# Patient Record
Sex: Male | Born: 1945 | Race: White | Hispanic: No
Health system: Southern US, Community
[De-identification: ages and names within clinical notes are randomized; demographics above are authoritative.]

## PROBLEM LIST (undated history)

## (undated) DIAGNOSIS — E785 Hyperlipidemia, unspecified: Secondary | ICD-10-CM

## (undated) DIAGNOSIS — I10 Essential (primary) hypertension: Secondary | ICD-10-CM

## (undated) DIAGNOSIS — E119 Type 2 diabetes mellitus without complications: Secondary | ICD-10-CM

## (undated) HISTORY — DX: Essential (primary) hypertension: I10

## (undated) HISTORY — DX: Hyperlipidemia, unspecified: E78.5

## (undated) HISTORY — DX: Type 2 diabetes mellitus without complications: E11.9

## (undated) HISTORY — PX: KNEE SURGERY: SHX244

## (undated) HISTORY — PX: BLADDER SURGERY: SHX569

---

## 2004-04-21 ENCOUNTER — Ambulatory Visit (HOSPITAL_COMMUNITY): Admission: RE | Admit: 2004-04-21 | Discharge: 2004-04-21 | Payer: Self-pay | Admitting: Urology

## 2004-04-21 ENCOUNTER — Ambulatory Visit (HOSPITAL_BASED_OUTPATIENT_CLINIC_OR_DEPARTMENT_OTHER): Admission: RE | Admit: 2004-04-21 | Discharge: 2004-04-21 | Payer: Self-pay | Admitting: Urology

## 2005-04-14 ENCOUNTER — Encounter: Admission: RE | Admit: 2005-04-14 | Discharge: 2005-04-14 | Payer: Self-pay | Admitting: Occupational Medicine

## 2006-05-09 ENCOUNTER — Encounter: Admission: RE | Admit: 2006-05-09 | Discharge: 2006-05-09 | Payer: Self-pay | Admitting: Occupational Medicine

## 2007-12-13 IMAGING — CR DG CHEST 1V
1 series · 1 of 1 positions shown · non-contrast
Comparison: 04/14/05.

CLINICAL DATA: Biannual physical exam.
 CHEST ? 1 VIEW:

[view not recorded]
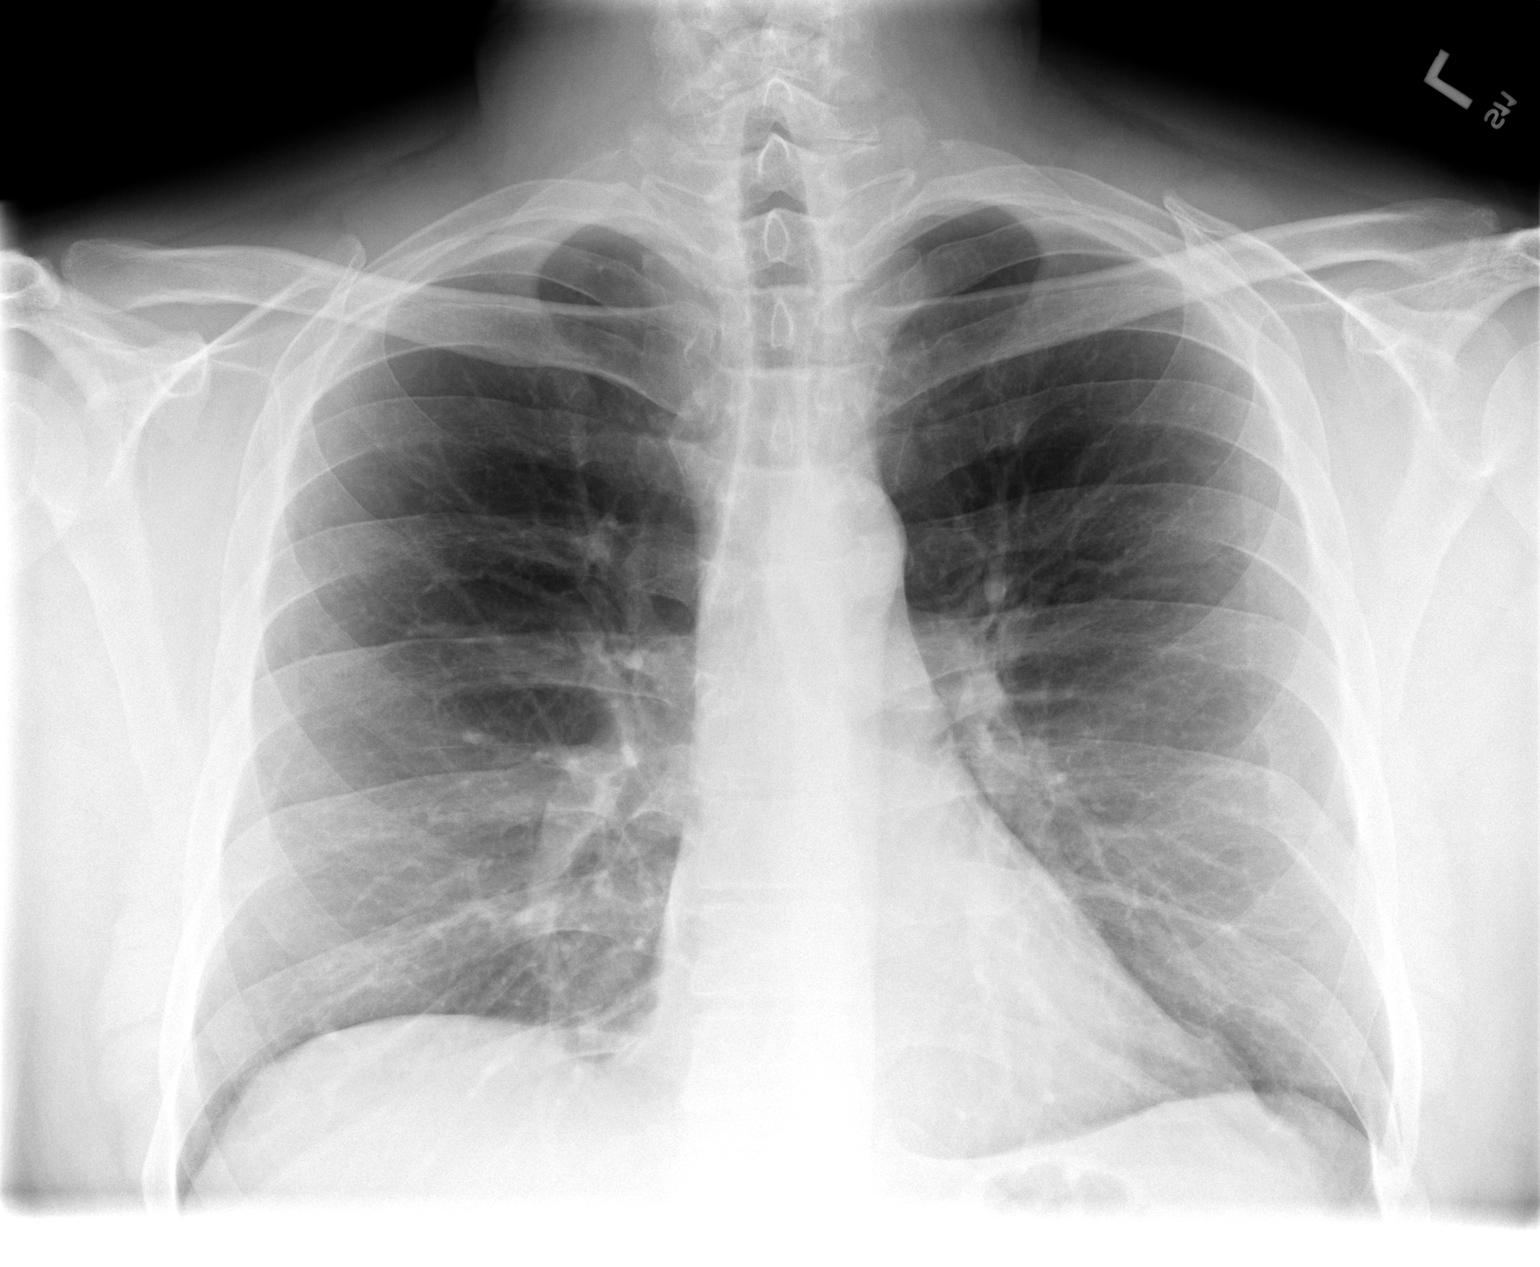

[1 of 1 positions shown; findings below may reference images not displayed]

FINDINGS: A single view of the chest shows the lungs to be clear.  Nipple shadows are noted at the lung bases.  The heart is within normal limits in size.   There are three vague nodular opacities at the left lung base.  I believe one of these represents nipple shadow but the other two nodules are less distinct.  Repeat deeper inspiration chest x-ray with nipple markers is recommended.
IMPRESSION: Probable nipple shadow left lung base although there are two vague nodular opacities adjacent.  Recommend repeat deeper inspiration PA chest with nipple markers.

## 2022-02-21 ENCOUNTER — Observation Stay (HOSPITAL_COMMUNITY)
Admission: EM | Admit: 2022-02-21 | Discharge: 2022-02-22 | Disposition: A | Discharge status: Home / Self Care | Payer: Medicare Other | Attending: Internal Medicine | Admitting: Internal Medicine

## 2022-02-21 ENCOUNTER — Encounter (HOSPITAL_COMMUNITY): Payer: Self-pay | Admitting: Emergency Medicine

## 2022-02-21 ENCOUNTER — Emergency Department (HOSPITAL_COMMUNITY): Payer: Medicare Other

## 2022-02-21 ENCOUNTER — Other Ambulatory Visit: Payer: Self-pay

## 2022-02-21 DIAGNOSIS — R0602 Shortness of breath: Secondary | ICD-10-CM | POA: Diagnosis present

## 2022-02-21 DIAGNOSIS — I10 Essential (primary) hypertension: Secondary | ICD-10-CM | POA: Diagnosis not present

## 2022-02-21 DIAGNOSIS — E669 Obesity, unspecified: Secondary | ICD-10-CM

## 2022-02-21 DIAGNOSIS — I82401 Acute embolism and thrombosis of unspecified deep veins of right lower extremity: Secondary | ICD-10-CM | POA: Insufficient documentation

## 2022-02-21 DIAGNOSIS — F419 Anxiety disorder, unspecified: Secondary | ICD-10-CM | POA: Diagnosis not present

## 2022-02-21 DIAGNOSIS — Z6839 Body mass index (BMI) 39.0-39.9, adult: Secondary | ICD-10-CM | POA: Diagnosis not present

## 2022-02-21 DIAGNOSIS — Z1152 Encounter for screening for COVID-19: Secondary | ICD-10-CM | POA: Diagnosis not present

## 2022-02-21 DIAGNOSIS — I2699 Other pulmonary embolism without acute cor pulmonale: Principal | ICD-10-CM | POA: Diagnosis present

## 2022-02-21 LAB — CBC WITH DIFFERENTIAL/PLATELET
Abs Immature Granulocytes: 0.07 10*3/uL (ref 0.00–0.07)
Basophils Absolute: 0 10*3/uL (ref 0.0–0.1)
Basophils Relative: 0 %
Eosinophils Absolute: 0.1 10*3/uL (ref 0.0–0.5)
Eosinophils Relative: 1 %
HCT: 49 % (ref 39.0–52.0)
Hemoglobin: 15.6 g/dL (ref 13.0–17.0)
Immature Granulocytes: 1 %
Lymphocytes Relative: 12 %
Lymphs Abs: 1.1 10*3/uL (ref 0.7–4.0)
MCH: 28.2 pg (ref 26.0–34.0)
MCHC: 31.8 g/dL (ref 30.0–36.0)
MCV: 88.6 fL (ref 80.0–100.0)
Monocytes Absolute: 0.6 10*3/uL (ref 0.1–1.0)
Monocytes Relative: 7 %
Neutro Abs: 7.4 10*3/uL (ref 1.7–7.7)
Neutrophils Relative %: 79 %
Platelets: 183 10*3/uL (ref 150–400)
RBC: 5.53 MIL/uL (ref 4.22–5.81)
RDW: 12.4 % (ref 11.5–15.5)
WBC: 9.3 10*3/uL (ref 4.0–10.5)
nRBC: 0 % (ref 0.0–0.2)

## 2022-02-21 LAB — COMPREHENSIVE METABOLIC PANEL
ALT: 11 U/L (ref 0–44)
AST: 13 U/L — ABNORMAL LOW (ref 15–41)
Albumin: 3.7 g/dL (ref 3.5–5.0)
Alkaline Phosphatase: 54 U/L (ref 38–126)
Anion gap: 8 (ref 5–15)
BUN: 18 mg/dL (ref 8–23)
CO2: 26 mmol/L (ref 22–32)
Calcium: 9.1 mg/dL (ref 8.9–10.3)
Chloride: 106 mmol/L (ref 98–111)
Creatinine, Ser: 1.16 mg/dL (ref 0.61–1.24)
GFR, Estimated: >60 mL/min (ref >60)
Glucose, Bld: 108 mg/dL — ABNORMAL HIGH (ref 70–99)
Potassium: 4.6 mmol/L (ref 3.5–5.1)
Sodium: 140 mmol/L (ref 135–145)
Total Bilirubin: 0.7 mg/dL (ref 0.3–1.2)
Total Protein: 7 g/dL (ref 6.5–8.1)

## 2022-02-21 LAB — PROTIME-INR
INR: 1.1 (ref 0.8–1.2)
Prothrombin Time: 14.3 seconds (ref 11.4–15.2)

## 2022-02-21 LAB — RESP PANEL BY RT-PCR (FLU A&B, COVID) ARPGX2
Influenza A by PCR: NEGATIVE
Influenza B by PCR: NEGATIVE
SARS Coronavirus 2 by RT PCR: NEGATIVE

## 2022-02-21 LAB — APTT: aPTT: 65 seconds — ABNORMAL HIGH (ref 24–36)

## 2022-02-21 LAB — D-DIMER, QUANTITATIVE: D-Dimer, Quant: 14.54 ug/mL-FEU — ABNORMAL HIGH (ref 0.00–0.50)

## 2022-02-21 LAB — TROPONIN I (HIGH SENSITIVITY)
Troponin I (High Sensitivity): 6 ng/L (ref <18)
Troponin I (High Sensitivity): 4 ng/L (ref <18)

## 2022-02-21 LAB — LACTIC ACID, PLASMA: Lactic Acid, Venous: 1 mmol/L (ref 0.5–1.9)

## 2022-02-21 MED ORDER — IOHEXOL 350 MG/ML SOLN
75.0000 mL | Freq: Once | INTRAVENOUS | Status: AC | PRN
  Administered 2022-02-21: 75 mL via INTRAVENOUS

## 2022-02-21 MED ORDER — HEPARIN BOLUS VIA INFUSION
2900.0000 [IU] | Freq: Once | INTRAVENOUS | Status: AC
  Administered 2022-02-21: 2900 [IU] via INTRAVENOUS
  Filled 2022-02-21: qty 2900

## 2022-02-21 MED ORDER — HEPARIN (PORCINE) 25000 UT/250ML-% IV SOLN
1600.0000 [IU]/h | INTRAVENOUS | Status: AC
  Administered 2022-02-21: 1600 [IU]/h via INTRAVENOUS
  Filled 2022-02-21: qty 250

## 2022-02-21 NOTE — H&P (Signed)
History and Physical    Patient: Patrick Krueger IRW:[id number] DOB: Sep 17, 1945 DOA: 02/21/2022 DOS: the patient was seen and examined on 02/22/2022 PCP: Daylene Katayama, PA  Patient coming from: Home  Chief Complaint:  Chief Complaint  Patient presents with   Shortness of Breath   HPI: Patrick Krueger is a 76 y.o. male with medical history significant of memory impairment, HTN, HLD, pre-DM, BPH who presents with acute shortness of breath.  Patient was at work today when recently developed acute shortness of breath with ambulation and presented to ED.  He denies any chest pain.  Last week he did have a viral illness where he was mostly bedbound.  Otherwise no recent travels.  No personal history of thrombosis.  Does not use tobacco.  Not on any hormonal replacement therapy.  Reports that mother around the age of 14 had a blood clot and was diagnosed with either protein C or S deficiency.  In the ED, temperature of 97.11F, HR 50 with BP of 184/82  D-dimer elevated to >14. CTA chest showing moderate size partially occlusive thrombus extending from the central right pulmonary artery into the lobar and segmental branches of the lower, mid, and upper lobes with right heart strain.   Troponin reassuring at 6-4. EKG in sinus rhythm without any ST changes.  Review of Systems: As mentioned in the history of present illness. All other systems reviewed and are negative. History reviewed. No pertinent past medical history. History reviewed. No pertinent surgical history. Social History: Denies any tobacco, alcohol illicit drug use.  Family history: Mother-thromboembolism  Prior to Admission medications   Not on File    Physical Exam: Vitals:   02/21/22 2100 02/21/22 2245 02/22/22 0102 02/22/22 0115  BP: (!) 184/84 (!) 174/82  (!) 148/85  Pulse: (!) 50 (!) 51  (!) 51  Resp: 18 20  18   Temp:   97.9 F (36.6 C)   TempSrc:      SpO2: 100% 100%  94%  Weight:      Height:        Constitutional: NAD, calm, comfortable, obese male laying flat in bed Eyes: lids and conjunctivae normal ENMT: Mucous membranes are moist.  Neck: normal, supple Respiratory: clear to auscultation bilaterally, no wheezing, no crackles. Normal respiratory effort. No accessory muscle use.  Cardiovascular: Regular rate and rhythm, no murmurs / rubs / gallops. No extremity edema.  Abdomen: no tenderness,  Bowel sounds positive.  Musculoskeletal: no clubbing / cyanosis. No joint deformity upper and lower extremities. Good ROM, no contractures. Normal muscle tone.  Skin: no rashes, lesions, ulcers. No induration Neurologic: CN 2-12 grossly intact.  Strength 5/5 in all 4.  Psychiatric: Normal judgment and insight. Alert and oriented x 3. Normal mood. Data Reviewed:  See HPI  Assessment and Plan: * Pulmonary embolism (HCC) -CTA revealing for moderate size partially occlusive thrombus extending from the central right pulmonary artery into the lobar and segmental branches of the lower,mid, and upper lobes with right heart strain.  -Troponin reassuring at 6-4 without any EKG changes -stable BP -keep on IV heparin infusion -obtain echocardiogram in the morning -There is a family hx of protein S or C deficiency. This episode is unprovoked and he will benefit from hematology follow up for further coagulopathy workup and deciding on length of anticoagulation therapy.  Obesity (BMI 30-39.9) BMI of 37  Anxiety -continue home SSRI pending med rec  HTN (hypertension) Mildly elevated. Continue to monitor -resume antihypertensive pending med rec  Advance Care Planning: Full  Consults: None  Family Communication: None at bedside  Severity of Illness: The appropriate patient status for this patient is OBSERVATION. Observation status is judged to be reasonable and necessary in order to provide the required intensity of service to ensure the patient's safety. The patient's presenting  symptoms, physical exam findings, and initial radiographic and laboratory data in the context of their medical condition is felt to place them at decreased risk for further clinical deterioration. Furthermore, it is anticipated that the patient will be medically stable for discharge from the hospital within 2 midnights of admission.   Author: Anselm Jungling, DO 02/22/2022 1:32 AM  For on call review [email or web address].

## 2022-02-21 NOTE — ED Provider Triage Note (Signed)
Emergency Medicine Provider Triage Evaluation Note  Edd Reppert , a 76 y.o. male  was evaluated in triage.  Pt complains of shortness of breath on exertion. Patient reported having viral illness last week that he was getting over but began having SOB today while walking at work and needing to take breaks to catch his breath. No history of prior clots, PE, or cardiovascular disease. Felt nauseous briefly while being moved by EMS. Denies chest pain, abdominal pain, dizziness, lightheadedness, or changes in vision when symptoms began.  Review of Systems  Positive: As above Negative: As above  Physical Exam  BP 129/77 (BP Location: Left Arm)   Pulse (!) 52   Temp 97.9 F (36.6 C) (Oral)   Resp 18   SpO2 100%  Gen:   Awake, no distress Resp:  Normal effort MSK:   Moves extremities without difficulty Other:  Heart and lungs CTAB. No murmurs or wheezing noted.  Medical Decision Making  Medically screening exam initiated at 1:17 PM.  Appropriate orders placed.  Clements Toro was informed that the remainder of the evaluation will be completed by another provider, this initial triage assessment does not replace that evaluation, and the importance of remaining in the ED until their evaluation is complete.   Smitty Knudsen, PA-C 02/21/22 1320

## 2022-02-21 NOTE — Progress Notes (Signed)
ANTICOAGULATION CONSULT NOTE - Initial Consult  Pharmacy Consult for heparin Indication: pulmonary embolus  Not on File  Patient Measurements: Height: 5\' 10"  (177.8 cm) Weight: 117.9 kg (260 lb) IBW/kg (Calculated) : 73 Heparin Dosing Weight: 99 kg  Vital Signs: Temp: 97.9 F (36.6 C) (12/04 2050) Temp Source: Oral (12/04 1259) BP: 129/77 (12/04 1259) Pulse Rate: 52 (12/04 1259)  Labs: Recent Labs    02/21/22 1355  HGB 15.6  HCT 49.0  PLT 183  CREATININE 1.16  TROPONINIHS 6    Estimated Creatinine Clearance: 71.9 mL/min (by C-G formula based on SCr of 1.16 mg/dL).   Medical History: History reviewed. No pertinent past medical history.    Assessment: 76 year old male presented with shortness of breath. CTA chest positive acute PE with evidence of right heart strain. No anticoagulation noted PTA. Pharmacy consulted to manage heparin.  Goal of Therapy:  Heparin level 0.3-0.7 units/ml Monitor platelets by anticoagulation protocol: Yes   Plan:  -Heparin 2900 unit bolus -Start heparin infusion at 1600 units/hr -Check heparin level 6 hours after start of infusion -Daily CBC while on heparin infusion -Monitor for signs/symptoms of bleeding  66, PharmD, BCPS Clinical Pharmacist 02/21/2022 8:55 PM

## 2022-02-21 NOTE — ED Provider Notes (Signed)
[facility]-EMERGENCY DEPT Provider Note   CSN: [id number] Arrival date & time: 02/21/22  1246     History {Add pertinent medical, surgical, social history, OB history to HPI:1} Chief Complaint  Patient presents with   Shortness of Breath    Patrick Krueger is a 76 y.o. male.  He has no significant past medical history.  He is here for complaint of acute shortness of breath that started around 11:00 today while he was walking at work.  No chest pain.  No cough.  He said last week he was sick with some sort of viral illness.  Tested negative for COVID and flu.  He was laid up in bed for it though.  That was more body aches and fatigue.  He is a non-smoker.  No prior history of PE but does have family history with mother had PE.  The history is provided by the patient.  Shortness of Breath Severity:  Moderate Onset quality:  Sudden Duration:  10 hours Timing:  Constant Progression:  Improving Chronicity:  New Relieved by:  Rest Worsened by:  Activity Ineffective treatments:  None tried Associated symptoms: no abdominal pain, no chest pain, no cough, no diaphoresis, no fever, no hemoptysis, no sore throat, no sputum production and no vomiting   Risk factors: no hx of cancer, no hx of PE/DVT and no tobacco use        Home Medications Prior to Admission medications   Not on File      Allergies    Patient has no allergy information on record.    Review of Systems   Review of Systems  Constitutional:  Negative for diaphoresis and fever.  HENT:  Negative for sore throat.   Respiratory:  Positive for shortness of breath. Negative for cough, hemoptysis and sputum production.   Cardiovascular:  Negative for chest pain.  Gastrointestinal:  Negative for abdominal pain and vomiting.  Musculoskeletal:  Positive for myalgias.    Physical Exam Updated Vital Signs BP 129/77 (BP Location: Left Arm)   Pulse (!) 52   Temp 97.9 F (36.6 C)   Resp 18   Ht 5\' 10"   (1.778 m)   Wt 117.9 kg   SpO2 100%   BMI 37.31 kg/m  Physical Exam Vitals and nursing note reviewed.  Constitutional:      General: He is not in acute distress.    Appearance: He is well-developed.  HENT:     Head: Normocephalic and atraumatic.  Eyes:     Conjunctiva/sclera: Conjunctivae normal.  Cardiovascular:     Rate and Rhythm: Normal rate and regular rhythm.     Heart sounds: No murmur heard. Pulmonary:     Effort: Pulmonary effort is normal. No respiratory distress.     Breath sounds: Normal breath sounds.  Abdominal:     Palpations: Abdomen is soft.     Tenderness: There is no abdominal tenderness.  Musculoskeletal:        General: No swelling.     Cervical back: Neck supple.     Right lower leg: No tenderness. Edema present.     Left lower leg: No tenderness. No edema.  Skin:    General: Skin is warm and dry.     Capillary Refill: Capillary refill takes less than 2 seconds.  Neurological:     General: No focal deficit present.     Mental Status: He is alert.     ED Results / Procedures / Treatments   Labs (all  labs ordered are listed, but only abnormal results are displayed) Labs Reviewed  D-DIMER, QUANTITATIVE - Abnormal; Notable for the following components:      Result Value   D-Dimer, Quant 14.54 (*)    All other components within normal limits  COMPREHENSIVE METABOLIC PANEL - Abnormal; Notable for the following components:   Glucose, Bld 108 (*)    AST 13 (*)    All other components within normal limits  CBC WITH DIFFERENTIAL/PLATELET  LACTIC ACID, PLASMA  LACTIC ACID, PLASMA  APTT  PROTIME-INR  TROPONIN I (HIGH SENSITIVITY)  TROPONIN I (HIGH SENSITIVITY)    EKG EKG Interpretation  Date/Time:  Monday February 21 2022 13:32:21 EST Ventricular Rate:  52 PR Interval:  191 QRS Duration: 103 QT Interval:  468 QTC Calculation: 436 R Axis:   -76 Text Interpretation: Sinus rhythm Left anterior fascicular block Abnormal R-wave progression,  late transition copy Confirmed by Meridee Score [phone]) on 02/21/2022 8:43:25 PM  Radiology CT Angio Chest PE W and/or Wo Contrast  Result Date: 02/21/2022 CLINICAL DATA:  Concern for pulmonary embolism. EXAM: CT ANGIOGRAPHY CHEST WITH CONTRAST TECHNIQUE: Multidetector CT imaging of the chest was performed using the standard protocol during bolus administration of intravenous contrast. Multiplanar CT image reconstructions and MIPs were obtained to evaluate the vascular anatomy. RADIATION DOSE REDUCTION: This exam was performed according to the departmental dose-optimization program which includes automated exposure control, adjustment of the mA and/or kV according to patient size and/or use of iterative reconstruction technique. CONTRAST:  62mL OMNIPAQUE IOHEXOL 350 MG/ML SOLN COMPARISON:  Chest radiograph dated 02/21/2022. FINDINGS: Cardiovascular: There is no cardiomegaly or pericardial effusion. The thoracic aorta is unremarkable. There is a moderate size partially occlusive thrombus extending from the central right pulmonary artery into the lobar and segmental branches of the lower, mid, and upper lobes. There is dilatation of the right ventricle with RV/LV ratio of 1.2 suggestive of right heart straining. Correlation with cardiac enzymes and EKG findings recommended. Mediastinum/Nodes: No hilar or mediastinal adenopathy. The esophagus and the thyroid gland are grossly unremarkable. No mediastinal fluid collection. Lungs/Pleura: There is linear and streaky atelectasis/scarring in the lingula. No consolidative changes. There is no pleural effusion or pneumothorax. The central airways are patent. Upper Abdomen: Multiple gallstones. A 1.5 cm left renal upper pole cyst. No imaging follow-up. Musculoskeletal: No acute osseous pathology. Review of the MIP images confirms the above findings. IMPRESSION: 1. Positive for acute PE with CT evidence of right heart strain (RV/LV Ratio = 1.2) consistent with at least  submassive (intermediate risk) PE. The presence of right heart strain has been associated with an increased risk of morbidity and mortality. Please refer to the "Code PE Focused" order set in EPIC. 2. Cholelithiasis. These results were called by telephone at the time of interpretation on 02/21/2022 at 8:36 pm to DR Joni Colegrove , who verbally acknowledged these results. Electronically Signed   By: Elgie Collard M.D.   On: 02/21/2022 20:37   DG Chest 2 View  Result Date: 02/21/2022 CLINICAL DATA:  Shortness of breath EXAM: CHEST - 2 VIEW COMPARISON:  05/09/2006 FINDINGS: Cardiac size is within normal limits. There are no signs of pulmonary edema. New small linear densities are seen in left lower lung field. There is no focal consolidation. There is no pleural effusion or pneumothorax. IMPRESSION: There are no signs of pulmonary edema or focal pulmonary consolidation. Small linear densities in lateral aspect of left lower lung fields may suggest scarring or subsegmental atelectasis. Electronically Signed  By: Elmer Picker M.D.   On: 02/21/2022 13:31    Procedures Procedures  {Document cardiac monitor, telemetry assessment procedure when appropriate:1}  Medications Ordered in ED Medications  iohexol (OMNIPAQUE) 350 MG/ML injection 75 mL (75 mLs Intravenous Contrast Given 02/21/22 2014)    ED Course/ Medical Decision Making/ A&P                           Medical Decision Making Amount and/or Complexity of Data Reviewed Labs: ordered. Radiology: ordered.   This patient complains of ***; this involves an extensive number of treatment Options and is a complaint that carries with it a high risk of complications and morbidity. The differential includes ***  I ordered, reviewed and interpreted labs, which included *** I ordered medication *** and reviewed PMP when indicated. I ordered imaging studies which included *** and I independently    visualized and interpreted imaging which showed  *** Additional history obtained from *** Previous records obtained and reviewed *** I consulted *** and discussed lab and imaging findings and discussed disposition.  Cardiac monitoring reviewed, *** Social determinants considered, *** Critical Interventions: ***  After the interventions stated above, I reevaluated the patient and found *** Admission and further testing considered, ***   {Document critical care time when appropriate:1} {Document review of labs and clinical decision tools ie heart score, Chads2Vasc2 etc:1}  {Document your independent review of radiology images, and any outside records:1} {Document your discussion with family members, caretakers, and with consultants:1} {Document social determinants of health affecting pt's care:1} {Document your decision making why or why not admission, treatments were needed:1} Final Clinical Impression(s) / ED Diagnoses Final diagnoses:  None    Rx / DC Orders ED Discharge Orders     None

## 2022-02-21 NOTE — ED Triage Notes (Signed)
Pt BIB [facility] with reports of Exertional SHOB that started while walking at work about 2 hours ago. Pt did have orthostatic changes on vitals per EMS.

## 2022-02-22 ENCOUNTER — Observation Stay (HOSPITAL_BASED_OUTPATIENT_CLINIC_OR_DEPARTMENT_OTHER): Payer: Medicare Other

## 2022-02-22 ENCOUNTER — Other Ambulatory Visit (HOSPITAL_COMMUNITY): Payer: Self-pay

## 2022-02-22 DIAGNOSIS — E669 Obesity, unspecified: Secondary | ICD-10-CM

## 2022-02-22 DIAGNOSIS — I2699 Other pulmonary embolism without acute cor pulmonale: Secondary | ICD-10-CM | POA: Diagnosis present

## 2022-02-22 DIAGNOSIS — F419 Anxiety disorder, unspecified: Secondary | ICD-10-CM

## 2022-02-22 LAB — CBC
HCT: 44.5 % (ref 39.0–52.0)
Hemoglobin: 14.4 g/dL (ref 13.0–17.0)
MCH: 28.4 pg (ref 26.0–34.0)
MCHC: 32.4 g/dL (ref 30.0–36.0)
MCV: 87.8 fL (ref 80.0–100.0)
Platelets: 166 10*3/uL (ref 150–400)
RBC: 5.07 MIL/uL (ref 4.22–5.81)
RDW: 12.5 % (ref 11.5–15.5)
WBC: 7.7 10*3/uL (ref 4.0–10.5)
nRBC: 0 % (ref 0.0–0.2)

## 2022-02-22 LAB — HEPARIN LEVEL (UNFRACTIONATED): Heparin Unfractionated: 0.58 IU/mL (ref 0.30–0.70)

## 2022-02-22 MED ORDER — ATORVASTATIN CALCIUM 10 MG PO TABS: 10.0000 mg | ORAL_TABLET | Freq: Every day | ORAL | Status: DC

## 2022-02-22 MED ORDER — APIXABAN 5 MG PO TABS
10.0000 mg | ORAL_TABLET | Freq: Two times a day (BID) | ORAL | Status: DC
  Administered 2022-02-22: 10 mg via ORAL
  Filled 2022-02-22: qty 2

## 2022-02-22 MED ORDER — VENLAFAXINE HCL ER 75 MG PO CP24
150.0000 mg | ORAL_CAPSULE | Freq: Every day | ORAL | Status: DC
  Administered 2022-02-22: 150 mg via ORAL
  Filled 2022-02-22: qty 2

## 2022-02-22 MED ORDER — LOSARTAN POTASSIUM 25 MG PO TABS: 50.0000 mg | ORAL_TABLET | Freq: Every day | ORAL | Status: DC

## 2022-02-22 MED ORDER — APIXABAN (ELIQUIS) VTE STARTER PACK (10MG AND 5MG): ORAL_TABLET | ORAL | 0 refills | Status: DC

## 2022-02-22 MED ORDER — APIXABAN (ELIQUIS) VTE STARTER PACK (10MG AND 5MG)
ORAL_TABLET | ORAL | 0 refills | Status: DC
  Filled 2022-02-22: qty 74, 30d supply, fill #0

## 2022-02-22 MED ORDER — APIXABAN 5 MG PO TABS: 5.0000 mg | ORAL_TABLET | Freq: Two times a day (BID) | ORAL | Status: DC

## 2022-02-22 MED ORDER — DIVALPROEX SODIUM 500 MG PO DR TAB
500.0000 mg | DELAYED_RELEASE_TABLET | Freq: Every day | ORAL | Status: DC
  Administered 2022-02-22: 500 mg via ORAL
  Filled 2022-02-22: qty 1

## 2022-02-22 NOTE — Progress Notes (Signed)
ANTICOAGULATION CONSULT NOTE   Pharmacy Consult for heparin Indication: pulmonary embolus  Not on File  Patient Measurements: Height: 5\' 10"  (177.8 cm) Weight: 117.9 kg (260 lb) IBW/kg (Calculated) : 73 Heparin Dosing Weight: 99 kg  Vital Signs: Temp: 98 F (36.7 C) (12/05 0557) Temp Source: Oral (12/05 0557) BP: 131/79 (12/05 0530) Pulse Rate: 50 (12/05 0530)  Labs: Recent Labs    02/21/22 1355 02/21/22 2116 02/21/22 2253 02/22/22 0535  HGB 15.6  --   --  14.4  HCT 49.0  --   --  44.5  PLT 183  --   --  166  APTT  --   --  65*  --   LABPROT  --   --  14.3  --   INR  --   --  1.1  --   HEPARINUNFRC  --   --   --  0.58  CREATININE 1.16  --   --   --   TROPONINIHS 6 4  --   --      Estimated Creatinine Clearance: 71.9 mL/min (by C-G formula based on SCr of 1.16 mg/dL).   Medical History: History reviewed. No pertinent past medical history.    Assessment: 76 year old male presented with shortness of breath. CTA chest positive acute PE with evidence of right heart strain. No anticoagulation noted PTA. Pharmacy consulted to manage heparin.  02/22/22 Heparin level = 0.58 (therapeutic) with heparin gtt @ 1600 units/hr CBC wnl No complications of therapy noted  Goal of Therapy:  Heparin level 0.3-0.7 units/ml Monitor platelets by anticoagulation protocol: Yes   Plan:  -Continue heparin infusion at 1600 units/hr -Recheck heparin level in 8 hours to confirm therapeutic dose -Daily CBC while on heparin infusion -Monitor for signs/symptoms of bleeding  14/05/23, PharmD 02/22/2022 6:13 AM

## 2022-02-22 NOTE — Assessment & Plan Note (Signed)
Mildly elevated. Continue to monitor -resume antihypertensive pending med rec

## 2022-02-22 NOTE — Assessment & Plan Note (Signed)
-  continue home SSRI pending med rec

## 2022-02-22 NOTE — Progress Notes (Signed)
Bilateral lower extremity venous duplex has been completed. Preliminary results can be found in CV Proc through chart review.  Results were given to Dr. Pola Corn.  02/22/22 10:53 AM Olen Cordial RVT

## 2022-02-22 NOTE — Assessment & Plan Note (Signed)
BMI of 37 

## 2022-02-22 NOTE — Assessment & Plan Note (Signed)
-  CTA revealing for moderate size partially occlusive thrombus extending from the central right pulmonary artery into the lobar and segmental branches of the lower,mid, and upper lobes with right heart strain.  -Troponin reassuring at 6-4 without any EKG changes -stable BP -keep on IV heparin infusion -obtain echocardiogram in the morning -There is a family hx of protein S or C deficiency. This episode is unprovoked and he will benefit from hematology follow up for further coagulopathy workup and deciding on length of anticoagulation therapy.

## 2022-02-22 NOTE — Discharge Instructions (Addendum)
Information on my medicine - ELIQUIS (apixaban)   Why was Eliquis prescribed for you? Eliquis was prescribed to treat blood clots that may have been found in the veins of your legs (deep vein thrombosis) or in your lungs (pulmonary embolism) and to reduce the risk of them occurring again.  What do You need to know about Eliquis ? The starting dose is 10 mg (two 5 mg tablets) taken TWICE daily for the FIRST SEVEN (7) DAYS, then on 03/01/2022 the dose is reduced to ONE 5 mg tablet taken TWICE daily.  Eliquis may be taken with or without food.   Try to take the dose about the same time in the morning and in the evening. If you have difficulty swallowing the tablet whole please discuss with your pharmacist how to take the medication safely.  Take Eliquis exactly as prescribed and DO NOT stop taking Eliquis without talking to the doctor who prescribed the medication.  Stopping may increase your risk of developing a new blood clot.  Refill your prescription before you run out.  After discharge, you should have regular check-up appointments with your healthcare provider that is prescribing your Eliquis.    What do you do if you miss a dose? If a dose of ELIQUIS is not taken at the scheduled time, take it as soon as possible on the same day and twice-daily administration should be resumed. The dose should not be doubled to make up for a missed dose.  Important Safety Information A possible side effect of Eliquis is bleeding. You should call your healthcare provider right away if you experience any of the following: Bleeding from an injury or your nose that does not stop. Unusual colored urine (red or dark brown) or unusual colored stools (red or black). Unusual bruising for unknown reasons. A serious fall or if you hit your head (even if there is no bleeding).  Some medicines may interact with Eliquis and might increase your risk of bleeding or clotting while on Eliquis. To help avoid  this, consult your healthcare provider or pharmacist prior to using any new prescription or non-prescription medications, including herbals, vitamins, non-steroidal anti-inflammatory drugs (NSAIDs) and supplements.  This website has more information on Eliquis (apixaban): http://www.eliquis.com/eliquis/home

## 2022-02-22 NOTE — Progress Notes (Signed)
ANTICOAGULATION CONSULT NOTE - Follow Up Consult  Pharmacy Consult for apixaban Indication: pulmonary embolus  No Known Allergies  Patient Measurements: Height: 5\' 10"  (177.8 cm) Weight: 117.9 kg (260 lb) IBW/kg (Calculated) : 73  Vital Signs: Temp: 98.1 F (36.7 C) (12/05 0950) Temp Source: Oral (12/05 0950) BP: 182/96 (12/05 0745) Pulse Rate: 55 (12/05 0950)  Labs: Recent Labs    02/21/22 1355 02/21/22 2116 02/21/22 2253 02/22/22 0535  HGB 15.6  --   --  14.4  HCT 49.0  --   --  44.5  PLT 183  --   --  166  APTT  --   --  65*  --   LABPROT  --   --  14.3  --   INR  --   --  1.1  --   HEPARINUNFRC  --   --   --  0.58  CREATININE 1.16  --   --   --   TROPONINIHS 6 4  --   --     Estimated Creatinine Clearance: 71.9 mL/min (by C-G formula based on SCr of 1.16 mg/dL).   Medications:  Currently on IV heparin at 1600 units/hr that was initiated on 12/4 at 2112  Assessment: 76 year old male presented with shortness of breath. CTA chest positive acute PE with evidence of right heart strain. No anticoagulation noted PTA. Yesterday evening, Pharmacy consulted to manage heparin and today has been consulted to transition to apixaban.  Goal of Therapy:  Treat PE Monitor platelets by anticoagulation protocol: Yes   Plan:  Discontinue IV heparin and associated labs At time IV heparin is stopped, start Apixaban 10 mg twice daily for 7 days followed by 5 mg twice daily Will provide apixaban education Monitor daily CBC as ordered by provider, signs/symptoms of bleeding   Thank you for allowing pharmacy to be a part of this patient's care.  66, PharmD, BCPS Clinical Pharmacist [place] Please utilize Amion for appropriate phone number to reach the unit pharmacist [facility]) 02/22/2022 11:14 AM

## 2022-02-22 NOTE — Discharge Summary (Signed)
Physician Discharge Summary  Trevar Boehringer WUJ:[id number] DOB: 10-27-1945 DOA: 02/21/2022  PCP: Daylene Katayama, PA  Admit date: 02/21/2022 Discharge date: 02/22/2022  Admitted From: Home Discharge disposition: Home  Recommendations at discharge:  You have been started on a blood thinner by the name Eliquis to be taken 10 mg twice daily for for 7 days followed by 5 mg twice daily. Outpatient referral for hematology follow-up given as well.   Brief narrative: Patrick Krueger is a 76 y.o. 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  EMS noted orthostatic changes in vital signs. Per report, last week he did have a viral illness and was mostly bedbound. Also reports that mother around the age of 89 had a blood clot and was diagnosed with either protein C or S deficiency.   In the ED, temperature of 97.16F, HR 50 with BP of 184/82 D-dimer elevated to >14.  CTA chest showing moderate size partially occlusive thrombus extending from the central right pulmonary artery into the lobar and segmental branches of the lower, mid, and upper lobes with right heart strain.  Troponin not elevated  EKG in sinus rhythm without any ST changes. Patient was started on IV heparin drip Admitted to [facility] Ultrasound duplex of lower extremities were obtained.  It showed an acute DVT of the right femoral vein, right popliteal vein, right posterior tibial vein, right soleal vein and right gastrocnemius vein.    Subjective: Patient was seen and examined this morning.  Pleasant elderly Caucasian male.  Propped up in bed.  Not in distress.  No new symptoms.  No chest pain at rest or on ambulation. Overnight, afebrile, heart rate in 40s and 50s.  Breathing on room air.  Assessment and plan: Acute pulmonary embolism  Acute right lower extremity DVT Presented with shortness of breath on  exertion and orthostatic vital signs  Recently had low and bedbound.  Also reported family history of DVT and protein C/protein S deficiency. CT angio finding as above. Ultrasound right lower extremity finding as above. Troponin normal.  Obtaining an echocardiogram would not change the plan at this time.  So I do not think he needs to wait for next several hours for echocardiogram only. This morning, I switched the patient from IV heparin to oral Eliquis.  Benefits with his insurance to check.  Discharged on oral Eliquis 10 mg twice daily for a week followed by 5 mg twice daily. Since there is a family hx of protein S or C deficiency. This episode is unprovoked and he will benefit from hematology follow up as an outpatient for further coagulopathy workup and deciding on length of anticoagulation therapy. Ambulatory referral for hematology follow-up given.  Essential hypertension PTA on losartan 50 mg daily.  Continue the same  HLD Lipitor  Anxiety Depakote, Effexor  Obesity -Body mass index is 37.31 kg/m. Patient has been advised to make an attempt to improve diet and exercise patterns to aid in weight loss.  Wounds:  -    Discharge Exam:   Vitals:   02/22/22 0730 02/22/22 0745 02/22/22 0950 02/22/22 1030  BP: (!) 153/78 (!) 182/96  (!) 156/118  Pulse: (!) 50 (!) 54 (!) 55 (!) 58  Resp: (!) 22  Temp:   98.1 F (36.7 C)   TempSrc:   Oral   SpO2: 93% 98% 91% 92%  Weight:  Height:        Body mass index is 37.31 kg/m.  General exam: Pleasant, elderly Caucasian male.  Not in physical distress Skin: No rashes, lesions or ulcers. HEENT: Atraumatic, normocephalic, no obvious bleeding Lungs: Clear to auscultation bilaterally CVS: Regular rate and rhythm, no murmur GI/Abd soft, nontender, nondistended, bowel sound present CNS: Alert, awake, oriented x 3 Psychiatry: Mood appropriate Extremities: No pedal edema, no calf tenderness  Follow ups:    Follow-up  Information     [facility], [place], [place] Follow up.   Specialty: Family Medicine Contact information: [address] [place] [address] [phone]         Jaci Standard, MD Follow up.   Specialty: Hematology and Oncology Contact information: [address] [place] [address] [phone]                 Discharge Instructions:   Discharge Instructions     Ambulatory referral to Hematology / Oncology   Complete by: As directed    DVT, PE Family history of protein C&S deficiency   Call MD for:  difficulty breathing, headache or visual disturbances   Complete by: As directed    Call MD for:  extreme fatigue   Complete by: As directed    Call MD for:  hives   Complete by: As directed    Call MD for:  persistant dizziness or light-headedness   Complete by: As directed    Call MD for:  persistant nausea and vomiting   Complete by: As directed    Call MD for:  severe uncontrolled pain   Complete by: As directed    Call MD for:  temperature >100.4   Complete by: As directed    Diet general   Complete by: As directed    Discharge instructions   Complete by: As directed    Recommendations at discharge:   You have been started on a blood thinner by the name Eliquis to be taken 10 mg twice daily for for 7 days followed by 5 mg twice daily.  Outpatient referral for hematology follow-up given as well.  General discharge instructions: Follow with Primary MD Daylene Katayama, PA in 7 days  Please request your PCP  to go over your hospital tests, procedures, radiology results at the follow up. Please get your medicines reviewed and adjusted.  Your PCP may decide to repeat certain labs or tests as needed. Do not drive, operate heavy machinery, perform activities at heights, swimming or participation in water activities or provide baby sitting services if your were admitted for syncope or siezures until you have seen by Primary MD or a  Neurologist and advised to do so again. [place] Controlled Substance Reporting System database was reviewed. Do not drive, operate heavy machinery, perform activities at heights, swim, participate in water activities or provide baby-sitting services while on medications for pain, sleep and mood until your outpatient physician has reevaluated you and advised to do so again.  You are strongly recommended to comply with the dose, frequency and duration of prescribed medications. Activity: As tolerated with Full fall precautions use walker/cane & assistance as needed Avoid using any recreational substances like cigarette, tobacco, alcohol, or non-prescribed drug. If you experience worsening of your admission symptoms, develop shortness of breath, life threatening emergency, suicidal or homicidal thoughts you must seek medical attention immediately by calling 911 or calling your MD immediately  if symptoms less severe. You must read complete instructions/literature  along with all the possible adverse reactions/side effects for all the medicines you take and that have been prescribed to you. Take any new medicine only after you have completely understood and accepted all the possible adverse reactions/side effects.  Wear Seat belts while driving. You were cared for by a hospitalist during your hospital stay. If you have any questions about your discharge medications or the care you received while you were in the hospital after you are discharged, you can call the unit and ask to speak with the hospitalist or the covering physician. Once you are discharged, your primary care physician will handle any further medical issues. Please note that NO REFILLS for any discharge medications will be authorized once you are discharged, as it is imperative that you return to your primary care physician (or establish a relationship with a primary care physician if you do not have one).   Increase activity slowly    Complete by: As directed        Discharge Medications:   Allergies as of 02/22/2022   No Known Allergies      Medication List     TAKE these medications    Apixaban Starter Pack (10mg  and 5mg ) Commonly known as: ELIQUIS STARTER PACK Take as directed on package: start with two-5mg  tablets twice daily for 7 days. On day 8, switch to one-5mg  tablet twice daily.   atorvastatin 10 MG tablet Commonly known as: LIPITOR Take 10 mg by mouth at bedtime.   Cholecalciferol 50 MCG (2000 UT) Caps Take 2,000 Units by mouth daily.   divalproex 500 MG DR tablet Commonly known as: DEPAKOTE Take 500 mg by mouth daily.   KP Fish Oil 1200 MG Caps Take 1 capsule by mouth 2 (two) times daily.   losartan 50 MG tablet Commonly known as: COZAAR Take 50 mg by mouth at bedtime.   venlafaxine XR 150 MG 24 hr capsule Commonly known as: EFFEXOR-XR Take 1 capsule by mouth daily.         The results of significant diagnostics from this hospitalization (including imaging, microbiology, ancillary and laboratory) are listed below for reference.    Procedures and Diagnostic Studies:   VAS LOWER EXTREMITY VENOUS (DVT)  Result Date: 02/22/2022  Lower Venous DVT Study Patient Name:  NAFTULA DONAHUE  Date of Exam:   02/22/2022 Medical Rec #: Maud Deed       Accession #:    [id number]/[id number] Date of Birth: 1945-06-14       Patient Gender: M Patient Age:   76 years Exam Location:  [facility] Procedure:      VAS 66 LOWER EXTREMITY VENOUS (DVT) Referring Phys: [facility] --------------------------------------------------------------------------------  Indications: Pulmonary embolism.  Risk Factors: Confirmed PE. Anticoagulation: Heparin. Comparison Study: No prior studies. Performing Technologist: [place] RVT  Examination Guidelines: A complete evaluation includes B-mode imaging, spectral Doppler, color Doppler, and power Doppler as needed of all accessible portions of each vessel. Bilateral  testing is considered an integral part of a complete examination. Limited examinations for reoccurring indications may be performed as noted. The reflux portion of the exam is performed with the patient in reverse Trendelenburg.  +---------+---------------+---------+-----------+----------+--------------+ RIGHT    CompressibilityPhasicitySpontaneityPropertiesThrombus Aging +---------+---------------+---------+-----------+----------+--------------+ CFV      Full           Yes      Yes                                 +---------+---------------+---------+-----------+----------+--------------+  SFJ      Full                                                        +---------+---------------+---------+-----------+----------+--------------+ FV Prox  Full                                                        +---------+---------------+---------+-----------+----------+--------------+ FV Mid   None           No       No                   Acute          +---------+---------------+---------+-----------+----------+--------------+ FV DistalNone           No       No                   Acute          +---------+---------------+---------+-----------+----------+--------------+ PFV      Full                                                        +---------+---------------+---------+-----------+----------+--------------+ POP      None           No       No                   Acute          +---------+---------------+---------+-----------+----------+--------------+ PTV      None                                         Acute          +---------+---------------+---------+-----------+----------+--------------+ PERO     Full                                                        +---------+---------------+---------+-----------+----------+--------------+ Soleal   None                                         Acute           +---------+---------------+---------+-----------+----------+--------------+ Gastroc  None                                         Acute          +---------+---------------+---------+-----------+----------+--------------+   +---------+---------------+---------+-----------+----------+--------------+ LEFT     CompressibilityPhasicitySpontaneityPropertiesThrombus Aging +---------+---------------+---------+-----------+----------+--------------+ CFV      Full           Yes  Yes                                 +---------+---------------+---------+-----------+----------+--------------+ SFJ      Full                                                        +---------+---------------+---------+-----------+----------+--------------+ FV Prox  Full                                                        +---------+---------------+---------+-----------+----------+--------------+ FV Mid   Full                                                        +---------+---------------+---------+-----------+----------+--------------+ FV DistalFull                                                        +---------+---------------+---------+-----------+----------+--------------+ PFV      Full                                                        +---------+---------------+---------+-----------+----------+--------------+ POP      Full           Yes      Yes                                 +---------+---------------+---------+-----------+----------+--------------+ PTV      Full                                                        +---------+---------------+---------+-----------+----------+--------------+ PERO     Full                                                        +---------+---------------+---------+-----------+----------+--------------+     Summary: RIGHT: - Findings consistent with acute deep vein thrombosis involving the right femoral vein, right popliteal  vein, right posterior tibial veins, right soleal veins, and right gastrocnemius veins. - No cystic structure found in the popliteal fossa.  LEFT: - There is no evidence of deep vein thrombosis in the lower extremity.  - No cystic structure found in the popliteal fossa.  *See table(s) above for measurements and observations.  Preliminary    CT Angio Chest PE W and/or Wo Contrast  Result Date: 02/21/2022 CLINICAL DATA:  Concern for pulmonary embolism. EXAM: CT ANGIOGRAPHY CHEST WITH CONTRAST TECHNIQUE: Multidetector CT imaging of the chest was performed using the standard protocol during bolus administration of intravenous contrast. Multiplanar CT image reconstructions and MIPs were obtained to evaluate the vascular anatomy. RADIATION DOSE REDUCTION: This exam was performed according to the departmental dose-optimization program which includes automated exposure control, adjustment of the mA and/or kV according to patient size and/or use of iterative reconstruction technique. CONTRAST:  75mL OMNIPAQUE IOHEXOL 350 MG/ML SOLN COMPARISON:  Chest radiograph dated 02/21/2022. FINDINGS: Cardiovascular: There is no cardiomegaly or pericardial effusion. The thoracic aorta is unremarkable. There is a moderate size partially occlusive thrombus extending from the central right pulmonary artery into the lobar and segmental branches of the lower, mid, and upper lobes. There is dilatation of the right ventricle with RV/LV ratio of 1.2 suggestive of right heart straining. Correlation with cardiac enzymes and EKG findings recommended. Mediastinum/Nodes: No hilar or mediastinal adenopathy. The esophagus and the thyroid gland are grossly unremarkable. No mediastinal fluid collection. Lungs/Pleura: There is linear and streaky atelectasis/scarring in the lingula. No consolidative changes. There is no pleural effusion or pneumothorax. The central airways are patent. Upper Abdomen: Multiple gallstones. A 1.5 cm left renal upper pole  cyst. No imaging follow-up. Musculoskeletal: No acute osseous pathology. Review of the MIP images confirms the above findings. IMPRESSION: 1. Positive for acute PE with CT evidence of right heart strain (RV/LV Ratio = 1.2) consistent with at least submassive (intermediate risk) PE. The presence of right heart strain has been associated with an increased risk of morbidity and mortality. Please refer to the "Code PE Focused" order set in EPIC. 2. Cholelithiasis. These results were called by telephone at the time of interpretation on 02/21/2022 at 8:36 pm to DR BUTLER , who verbally acknowledged these results. Electronically Signed   By: Elgie Collard M.D.   On: 02/21/2022 20:37   DG Chest 2 View  Result Date: 02/21/2022 CLINICAL DATA:  Shortness of breath EXAM: CHEST - 2 VIEW COMPARISON:  05/09/2006 FINDINGS: Cardiac size is within normal limits. There are no signs of pulmonary edema. New small linear densities are seen in left lower lung field. There is no focal consolidation. There is no pleural effusion or pneumothorax. IMPRESSION: There are no signs of pulmonary edema or focal pulmonary consolidation. Small linear densities in lateral aspect of left lower lung fields may suggest scarring or subsegmental atelectasis. Electronically Signed   By: Ernie Avena M.D.   On: 02/21/2022 13:31     Labs:   Basic Metabolic Panel: Recent Labs  Lab 02/21/22 1355  NA 140  K 4.6  CL 106  CO2 26  GLUCOSE 108*  BUN 18  CREATININE 1.16  CALCIUM 9.1   GFR Estimated Creatinine Clearance: 71.9 mL/min (by C-G formula based on SCr of 1.16 mg/dL). Liver Function Tests: Recent Labs  Lab 02/21/22 1355  AST 13*  ALT 11  ALKPHOS 54  BILITOT 0.7  PROT 7.0  ALBUMIN 3.7   No results for input(s): "LIPASE", "AMYLASE" in the last 168 hours. No results for input(s): "AMMONIA" in the last 168 hours. Coagulation profile Recent Labs  Lab 02/21/22 2253  INR 1.1    CBC: Recent Labs  Lab  02/21/22 1355 02/22/22 0535  WBC 9.3 7.7  NEUTROABS 7.4  --   HGB 15.6 14.4  HCT 49.0 44.5  MCV 88.6  87.8  PLT 183 166   Cardiac Enzymes: No results for input(s): "CKTOTAL", "CKMB", "CKMBINDEX", "TROPONINI" in the last 168 hours. BNP: Invalid input(s): "POCBNP" CBG: No results for input(s): "GLUCAP" in the last 168 hours. D-Dimer Recent Labs    02/21/22 1355  DDIMER 14.54*   Hgb A1c No results for input(s): "HGBA1C" in the last 72 hours. Lipid Profile No results for input(s): "CHOL", "HDL", "LDLCALC", "TRIG", "CHOLHDL", "LDLDIRECT" in the last 72 hours. Thyroid function studies No results for input(s): "TSH", "T4TOTAL", "T3FREE", "THYROIDAB" in the last 72 hours.  Invalid input(s): "FREET3" Anemia work up No results for input(s): "VITAMINB12", "FOLATE", "FERRITIN", "TIBC", "IRON", "RETICCTPCT" in the last 72 hours. Microbiology Recent Results (from the past 240 hour(s))  Resp Panel by RT-PCR (Flu A&B, Covid) Anterior Nasal Swab     Status: None   Collection Time: 02/21/22  9:35 PM   Specimen: Anterior Nasal Swab  Result Value Ref Range Status   SARS Coronavirus 2 by RT PCR NEGATIVE NEGATIVE Final    Comment: (NOTE) SARS-CoV-2 target nucleic acids are NOT DETECTED.  The SARS-CoV-2 RNA is generally detectable in upper respiratory specimens during the acute phase of infection. The lowest concentration of SARS-CoV-2 viral copies this assay can detect is 138 copies/mL. A negative result does not preclude SARS-Cov-2 infection and should not be used as the sole basis for treatment or other patient management decisions. A negative result may occur with  improper specimen collection/handling, submission of specimen other than nasopharyngeal swab, presence of viral mutation(s) within the areas targeted by this assay, and inadequate number of viral copies(<138 copies/mL). A negative result must be combined with clinical observations, patient history, and  epidemiological information. The expected result is Negative.  Fact Sheet for Patients:  BloggerCourse.com  Fact Sheet for Healthcare Providers:  SeriousBroker.it  This test is no t yet approved or cleared by the [place] FDA and  has been authorized for detection and/or diagnosis of SARS-CoV-2 by FDA under an Emergency Use Authorization (EUA). This EUA will remain  in effect (meaning this test can be used) for the duration of the COVID-19 declaration under Section 564(b)(1) of the Act, 21 U.S.C.section 360bbb-3(b)(1), unless the authorization is terminated  or revoked sooner.       Influenza A by PCR NEGATIVE NEGATIVE Final   Influenza B by PCR NEGATIVE NEGATIVE Final    Comment: (NOTE) The Xpert Xpress SARS-CoV-2/FLU/RSV plus assay is intended as an aid in the diagnosis of influenza from Nasopharyngeal swab specimens and should not be used as a sole basis for treatment. Nasal washings and aspirates are unacceptable for Xpert Xpress SARS-CoV-2/FLU/RSV testing.  Fact Sheet for Patients: BloggerCourse.com  Fact Sheet for Healthcare Providers: SeriousBroker.it  This test is not yet approved or cleared by the [place] FDA and has been authorized for detection and/or diagnosis of SARS-CoV-2 by FDA under an Emergency Use Authorization (EUA). This EUA will remain in effect (meaning this test can be used) for the duration of the COVID-19 declaration under Section 564(b)(1) of the Act, 21 U.S.C. section 360bbb-3(b)(1), unless the authorization is terminated or revoked.  Performed at [facility], [address] [place], [place] [address]     Time coordinating discharge: 35 minutes  Signed: Melina Schools Tymothy Cass  [facility] 02/22/2022, 12:06 PM

## 2022-02-24 ENCOUNTER — Telehealth: Payer: Self-pay | Admitting: Hematology and Oncology

## 2022-02-24 NOTE — Telephone Encounter (Signed)
Scheduled appointment per referral. Patient is aware of appointment date and time. Patient is aware to arrive 15 mins prior to appointment time and to bring updated insurance cards. Patient is aware of location.   

## 2022-03-18 ENCOUNTER — Inpatient Hospital Stay: Payer: Medicare Other

## 2022-03-18 ENCOUNTER — Other Ambulatory Visit: Payer: Self-pay

## 2022-03-18 ENCOUNTER — Telehealth: Payer: Self-pay | Admitting: Hematology and Oncology

## 2022-03-18 ENCOUNTER — Inpatient Hospital Stay: Payer: Medicare Other | Attending: Hematology and Oncology | Admitting: Hematology and Oncology

## 2022-03-18 VITALS — BP 167/83 | HR 58 | Temp 98.2°F | Resp 18 | Wt 263.6 lb

## 2022-03-18 DIAGNOSIS — I2699 Other pulmonary embolism without acute cor pulmonale: Secondary | ICD-10-CM | POA: Diagnosis present

## 2022-03-18 DIAGNOSIS — Z7901 Long term (current) use of anticoagulants: Secondary | ICD-10-CM | POA: Insufficient documentation

## 2022-03-18 DIAGNOSIS — I82411 Acute embolism and thrombosis of right femoral vein: Secondary | ICD-10-CM | POA: Diagnosis not present

## 2022-03-18 DIAGNOSIS — E785 Hyperlipidemia, unspecified: Secondary | ICD-10-CM | POA: Diagnosis not present

## 2022-03-18 DIAGNOSIS — Z79899 Other long term (current) drug therapy: Secondary | ICD-10-CM | POA: Diagnosis not present

## 2022-03-18 DIAGNOSIS — I10 Essential (primary) hypertension: Secondary | ICD-10-CM | POA: Insufficient documentation

## 2022-03-18 DIAGNOSIS — I2609 Other pulmonary embolism with acute cor pulmonale: Secondary | ICD-10-CM | POA: Diagnosis not present

## 2022-03-18 DIAGNOSIS — Z832 Family history of diseases of the blood and blood-forming organs and certain disorders involving the immune mechanism: Secondary | ICD-10-CM | POA: Insufficient documentation

## 2022-03-18 LAB — CBC WITH DIFFERENTIAL (CANCER CENTER ONLY)
Abs Immature Granulocytes: 0.04 10*3/uL (ref 0.00–0.07)
Basophils Absolute: 0 10*3/uL (ref 0.0–0.1)
Basophils Relative: 1 %
Eosinophils Absolute: 0.1 10*3/uL (ref 0.0–0.5)
Eosinophils Relative: 2 %
HCT: 45.4 % (ref 39.0–52.0)
Hemoglobin: 15.4 g/dL (ref 13.0–17.0)
Immature Granulocytes: 1 %
Lymphocytes Relative: 29 %
Lymphs Abs: 1.5 10*3/uL (ref 0.7–4.0)
MCH: 29.1 pg (ref 26.0–34.0)
MCHC: 33.9 g/dL (ref 30.0–36.0)
MCV: 85.8 fL (ref 80.0–100.0)
Monocytes Absolute: 0.4 10*3/uL (ref 0.1–1.0)
Monocytes Relative: 8 %
Neutro Abs: 3.2 10*3/uL (ref 1.7–7.7)
Neutrophils Relative %: 59 %
Platelet Count: 155 10*3/uL (ref 150–400)
RBC: 5.29 MIL/uL (ref 4.22–5.81)
RDW: 12.7 % (ref 11.5–15.5)
WBC Count: 5.3 10*3/uL (ref 4.0–10.5)
nRBC: 0 % (ref 0.0–0.2)

## 2022-03-18 LAB — CMP (CANCER CENTER ONLY)
ALT: 11 U/L (ref 0–44)
AST: 14 U/L — ABNORMAL LOW (ref 15–41)
Albumin: 4.1 g/dL (ref 3.5–5.0)
Alkaline Phosphatase: 53 U/L (ref 38–126)
Anion gap: 5 (ref 5–15)
BUN: 16 mg/dL (ref 8–23)
CO2: 29 mmol/L (ref 22–32)
Calcium: 9.1 mg/dL (ref 8.9–10.3)
Chloride: 106 mmol/L (ref 98–111)
Creatinine: 0.9 mg/dL (ref 0.61–1.24)
GFR, Estimated: >60 mL/min (ref >60)
Glucose, Bld: 154 mg/dL — ABNORMAL HIGH (ref 70–99)
Potassium: 4.2 mmol/L (ref 3.5–5.1)
Sodium: 140 mmol/L (ref 135–145)
Total Bilirubin: 0.5 mg/dL (ref 0.3–1.2)
Total Protein: 6.6 g/dL (ref 6.5–8.1)

## 2022-03-18 NOTE — Progress Notes (Signed)
[facility] Telephone:([phone]   [phone]  INITIAL CONSULT NOTE  Patient Care Team: Daylene KatayamaGordnier, Hilary, [place] as PCP - General (Family Medicine)  Hematological/Oncological History # Pulmonary Embolism/ RLE DVT  02/21/2022: CT PE study showed acute PE with CT evidence of right heart strain  02/22/2022: US LE showed cute deep vein thrombosis involving the right  femoral vein, right popliteal vein, right posterior tibial veins, right  soleal veins, and right gastrocnemius veins.  03/18/2022: establish care with Dr. Leonides Schanzorsey   CHIEF COMPLAINTS/PURPOSE OF CONSULTATION:  "Pulmonary Embolism/ RLE DVT  "  HISTORY OF PRESENTING ILLNESS:  Patrick Krueger 76 y.o. male with medical history significant for hypertension and hyperlipidemia who presents for evaluation of a provoked pulmonary embolism and right lower extremity DVT.    On review of the previous records Mr. Patrick Krueger presented to the emergency department on 02/21/2022 and underwent a CT PE study which showed acute pulmonary embolism with CT evidence of right heart strain.  He also underwent an ultrasound of his right lower extremity which showed deep vein clot involving the right femoral vein, popliteal vein, posterior tibial veins, and soleal veins.  He was subsequently treated with Eliquis therapy and discharged.  Due to concern for this pulmonary embolism and right lower extremity DVT he was referred to hematology for further evaluation and management.  On exam today Mr. Patrick Krueger reports that prior to his blood clot he had RSV infection and was "laying around the house all week".  He spending most of his time in the recliner.  He notes that after he went back to work he began feeling unwell.  He got short of breath walking short distance and reports that everyone reported he was having "labored breathing".  They were worried about him and requested to go to the emergency department at which time he was found to have his  blood clot.  He was started on Eliquis therapy and reports he has not been having any pain in his leg, swelling his leg, chest pain, shortness of breath since that time.  On further discussion he reports his family history is remarkable for a blood clot in his mother in her 6570s.  He also report his father passed away in his 90s and had skin cancer.  He has a healthy sister and no children.  He also had a brother who had a heart attack in his 3140s.  The patient is currently divorced with no children.  He reports that he is a never smoker but does drink alcohol occasionally.  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  He is tolerating Eliquis therapy well with no major bleeding, bruising, or dark stools.  He reports he is pink summer between $18 and $30 per month for the medication.  He otherwise denies any fevers, chills, sweats, nausea, vomiting or diarrhea.  A full 10 point ROS was otherwise negative.  MEDICAL HISTORY:  No past medical history on file.  SURGICAL HISTORY: No past surgical history on file.  SOCIAL HISTORY: Social History   Socioeconomic History   Marital status: Married    Spouse name: Not on file   Number of children: Not on file   Years of education: Not on file   Highest education level: Not on file  Occupational History   Not on file  Tobacco Use   Smoking status: Not on file   Smokeless tobacco: Not on file  Substance and Sexual Activity   Alcohol use: Not on file  Drug use: Not on file   Sexual activity: Not on file  Other Topics Concern   Not on file  Social History Narrative   Not on file   Social Determinants of Health   Financial Resource Strain: Not on file  Food Insecurity: Not on file  Transportation Needs: Not on file  Physical Activity: Not on file  Stress: Not on file  Social Connections: Not on file  Intimate Partner Violence: Not on file    FAMILY HISTORY: No family history on file.  ALLERGIES:  has No Known Allergies.  MEDICATIONS:   Current Outpatient Medications  Medication Sig Dispense Refill   atorvastatin (LIPITOR) 10 MG tablet Take 10 mg by mouth at bedtime.     Cholecalciferol 50 MCG (2000 UT) CAPS Take 2,000 Units by mouth daily.     divalproex (DEPAKOTE) 500 MG DR tablet Take 500 mg by mouth daily.     losartan (COZAAR) 50 MG tablet Take 50 mg by mouth at bedtime.     Omega-3 Fatty Acids (KP FISH OIL) 1200 MG CAPS Take 1 capsule by mouth 2 (two) times daily.     venlafaxine XR (EFFEXOR-XR) 150 MG 24 hr capsule Take 1 capsule by mouth daily.     No current facility-administered medications for this visit.    REVIEW OF SYSTEMS:   Constitutional: ( - ) fevers, ( - )  chills , ( - ) night sweats Eyes: ( - ) blurriness of vision, ( - ) double vision, ( - ) watery eyes Ears, nose, mouth, throat, and face: ( - ) mucositis, ( - ) sore throat Respiratory: ( - ) cough, ( - ) dyspnea, ( - ) wheezes Cardiovascular: ( - ) palpitation, ( - ) chest discomfort, ( - ) lower extremity swelling Gastrointestinal:  ( - ) nausea, ( - ) heartburn, ( - ) change in bowel habits Skin: ( - ) abnormal skin rashes Lymphatics: ( - ) new lymphadenopathy, ( - ) easy bruising Neurological: ( - ) numbness, ( - ) tingling, ( - ) new weaknesses Behavioral/Psych: ( - ) mood change, ( - ) new changes  All other systems were reviewed with the patient and are negative.  PHYSICAL EXAMINATION:  Vitals:   03/18/22 1312  BP: (!) 167/83  Pulse: (!) 58  Resp: 18  Temp: 98.2 F (36.8 C)  SpO2: 97%   Filed Weights   03/18/22 1312  Weight: 263 lb 9.6 oz (119.6 kg)    GENERAL: well appearing elderly Caucasian male in NAD  SKIN: skin color, texture, turgor are normal, no rashes or significant lesions EYES: conjunctiva are pink and non-injected, sclera clear LUNGS: clear to auscultation and percussion with normal breathing effort HEART: regular rate & rhythm and no murmurs and no lower extremity edema Musculoskeletal: no cyanosis of digits  and no clubbing  PSYCH: alert & oriented x 3, fluent speech NEURO: no focal motor/sensory deficits  LABORATORY DATA:  I have reviewed the data as listed    Latest Ref Rng & Units 03/18/2022    2:13 PM 02/22/2022    5:35 AM 02/21/2022    1:55 PM  CBC  WBC 4.0 - 10.5 K/uL 5.3  7.7  9.3   Hemoglobin 13.0 - 17.0 g/dL 64.4  03.4  74.2   Hematocrit 39.0 - 52.0 % 45.4  44.5  49.0   Platelets 150 - 400 K/uL 155  166  183        Latest Ref Rng & Units  03/18/2022    2:13 PM 02/21/2022    1:55 PM  CMP  Glucose 70 - 99 mg/dL 203  559   BUN 8 - 23 mg/dL 16  18   Creatinine 7.41 - 1.24 mg/dL 6.38  4.53   Sodium 646 - 145 mmol/L 140  140   Potassium 3.5 - 5.1 mmol/L 4.2  4.6   Chloride 98 - 111 mmol/L 106  106   CO2 22 - 32 mmol/L 29  26   Calcium 8.9 - 10.3 mg/dL 9.1  9.1   Total Protein 6.5 - 8.1 g/dL 6.6  7.0   Total Bilirubin 0.3 - 1.2 mg/dL 0.5  0.7   Alkaline Phos 38 - 126 U/L 53  54   AST 15 - 41 U/L 14  13   ALT 0 - 44 U/L 11  11      ASSESSMENT & PLAN Jahari Billy 76 y.o. male with medical history significant for hypertension and hyperlipidemia who presents for evaluation of a provoked pulmonary embolism and right lower extremity DVT.    After review of the labs, review of the records, and discussion with the patient the patients findings are most consistent with a provoked pulmonary embolism in the setting of prolonged immobility and RSV infection.  A provoked venous thromboembolism (VTE) is one that has a clear inciting factor or event. Provoking factors include prolonged travel/immobility, surgery (particularly abdominal or orthopedic), trauma,  and pregnancy/ estrogen containing birth control. This patient was reported to have a RSV infection and prolonged immobility, which would qualify as a transient provoking factor. As such we would recommend 3-6 months of anticoagulation therapy with consideration of additional therapy if symptoms persist. The anticoagulation therapy of  choice in this situation is continued Eliquis. The patient has a supply of this medication and can afford it without difficulty. We will plan to see the patient back in 3 months time to reassess and assure they are doing well on treatment.   #Provoked DVT/Pulmonary Embolism  --findings at this time are consistent with a provoked VTE  --will order baseline CMP and CBC to assure labs are adequate for DOAC therapy  --recommend the patient continue eliquis 5mg  BID --patient denies any bleeding, bruising, or dark stools on this medication. It is well tolerated. No difficulties accessing/affording the medication  --Due to heart strain noted during his hospitalization would recommend TTE in order to assure this is no longer an issue. --RTC in 6 months' time with strict return precautions for overt signs of bleeding  Orders Placed This Encounter  Procedures   CBC with Differential (Cancer Center Only)    Standing Status:   Future    Number of Occurrences:   1    Standing Expiration Date:   03/19/2023   CMP (Cancer Center only)    Standing Status:   Future    Number of Occurrences:   1    Standing Expiration Date:   03/19/2023   Beta-2-glycoprotein i abs, IgG/M/A    Standing Status:   Future    Number of Occurrences:   1    Standing Expiration Date:   03/18/2023   Cardiolipin antibodies, IgG, IgM, IgA*    Standing Status:   Future    Number of Occurrences:   1    Standing Expiration Date:   03/18/2023   ECHOCARDIOGRAM COMPLETE    Standing Status:   Future    Standing Expiration Date:   03/19/2023    Order Specific Question:  Where should this test be performed    Answer:   [facility]    Order Specific Question:   Perflutren DEFINITY (image enhancing agent) should be administered unless hypersensitivity or allergy exist    Answer:   Administer Perflutren    Order Specific Question:   Is a special reader required? (athlete or structural heart)    Answer:   No    Order Specific Question:    Does this study need to be read by the Structural team/Level 3 readers?    Answer:   No    Order Specific Question:   Reason for exam-Echo    Answer:   Pulmonary Embolus  I26.09    All questions were answered. The patient knows to call the clinic with any problems, questions or concerns.  A total of more than 60 minutes were spent on this encounter with face-to-face time and non-face-to-face time, including preparing to see the patient, ordering tests and/or medications, counseling the patient and coordination of care as outlined above.   Ulysees Barns, MD Department of Hematology/Oncology [facility] Phone: [phone] Pager: [phone] Email: [email or web address]  03/18/2022 3:42 PM

## 2022-03-18 NOTE — Telephone Encounter (Signed)
Called patient to schedule f/u. Phone disconnected. Mailing reminders.

## 2022-03-19 LAB — CARDIOLIPIN ANTIBODIES, IGG, IGM, IGA
Anticardiolipin IgA: <9 APL U/mL (ref 0–11)
Anticardiolipin IgG: <9 GPL U/mL (ref 0–14)
Anticardiolipin IgM: <9 MPL U/mL (ref 0–12)

## 2022-03-20 LAB — BETA-2-GLYCOPROTEIN I ABS, IGG/M/A
Beta-2 Glyco I IgG: <9 GPI IgG units (ref 0–20)
Beta-2-Glycoprotein I IgA: <9 GPI IgA units (ref 0–25)
Beta-2-Glycoprotein I IgM: <9 GPI IgM units (ref 0–32)

## 2022-03-24 ENCOUNTER — Ambulatory Visit (HOSPITAL_COMMUNITY)
Admission: RE | Admit: 2022-03-24 | Discharge: 2022-03-24 | Disposition: A | Discharge status: Home / Self Care | Payer: Medicare Other | Source: Ambulatory Visit | Attending: Hematology and Oncology | Admitting: Hematology and Oncology

## 2022-03-24 DIAGNOSIS — I082 Rheumatic disorders of both aortic and tricuspid valves: Secondary | ICD-10-CM | POA: Insufficient documentation

## 2022-03-24 DIAGNOSIS — E785 Hyperlipidemia, unspecified: Secondary | ICD-10-CM | POA: Diagnosis not present

## 2022-03-24 DIAGNOSIS — I10 Essential (primary) hypertension: Secondary | ICD-10-CM | POA: Diagnosis not present

## 2022-03-24 DIAGNOSIS — I2609 Other pulmonary embolism with acute cor pulmonale: Secondary | ICD-10-CM | POA: Insufficient documentation

## 2022-03-24 LAB — ECHOCARDIOGRAM COMPLETE
Area-P 1/2: 2.09 cm2
S' Lateral: 4.4 cm

## 2022-03-24 NOTE — Progress Notes (Signed)
  Echocardiogram 2D Echocardiogram has been performed.  Darlina Sicilian M 03/24/2022, 12:08 PM

## 2022-03-25 ENCOUNTER — Telehealth: Payer: Self-pay | Admitting: *Deleted

## 2022-03-25 NOTE — Telephone Encounter (Signed)
-----   Message from Orson Slick, MD sent at 03/24/2022  2:11 PM EST ----- Please let Mr. Bohnenkamp know that his TTE showed no evidence of continued heart strain. Anticoagulation therapy appears to be working. We will be seeing him back as scheduled   ----- Message ----- From: Interface, [employer] Sent: 03/24/2022   2:00 PM EST To: Orson Slick, MD

## 2022-03-25 NOTE — Telephone Encounter (Signed)
TCT patient regarding recent  TTE. Spoke with patient and advised that his TTE showed no evidence of continued heart strain. Anticoagulation therapy appears to be working. We will be seeing him back as scheduled . Pt voiced understanding. He is aware of his appt in June 2-24

## 2022-09-16 ENCOUNTER — Other Ambulatory Visit: Payer: Self-pay | Admitting: Hematology and Oncology

## 2022-09-16 ENCOUNTER — Inpatient Hospital Stay: Payer: Medicare Other | Attending: Hematology and Oncology

## 2022-09-16 ENCOUNTER — Inpatient Hospital Stay (HOSPITAL_BASED_OUTPATIENT_CLINIC_OR_DEPARTMENT_OTHER): Payer: Medicare Other | Admitting: Hematology and Oncology

## 2022-09-16 ENCOUNTER — Other Ambulatory Visit: Payer: Self-pay

## 2022-09-16 VITALS — BP 140/82 | HR 48 | Temp 97.5°F | Resp 17 | Wt 250.5 lb

## 2022-09-16 DIAGNOSIS — Z86711 Personal history of pulmonary embolism: Secondary | ICD-10-CM | POA: Insufficient documentation

## 2022-09-16 DIAGNOSIS — Z7901 Long term (current) use of anticoagulants: Secondary | ICD-10-CM | POA: Insufficient documentation

## 2022-09-16 DIAGNOSIS — I82411 Acute embolism and thrombosis of right femoral vein: Secondary | ICD-10-CM

## 2022-09-16 DIAGNOSIS — I2609 Other pulmonary embolism with acute cor pulmonale: Secondary | ICD-10-CM | POA: Diagnosis not present

## 2022-09-16 DIAGNOSIS — Z86718 Personal history of other venous thrombosis and embolism: Secondary | ICD-10-CM | POA: Diagnosis not present

## 2022-09-16 LAB — CMP (CANCER CENTER ONLY)
ALT: 9 U/L (ref 0–44)
AST: 12 U/L — ABNORMAL LOW (ref 15–41)
Albumin: 3.7 g/dL (ref 3.5–5.0)
Alkaline Phosphatase: 44 U/L (ref 38–126)
Anion gap: 3 — ABNORMAL LOW (ref 5–15)
BUN: 17 mg/dL (ref 8–23)
CO2: 31 mmol/L (ref 22–32)
Calcium: 8.9 mg/dL (ref 8.9–10.3)
Chloride: 108 mmol/L (ref 98–111)
Creatinine: 0.85 mg/dL (ref 0.61–1.24)
GFR, Estimated: >60 mL/min (ref >60)
Glucose, Bld: 120 mg/dL — ABNORMAL HIGH (ref 70–99)
Potassium: 4.2 mmol/L (ref 3.5–5.1)
Sodium: 142 mmol/L (ref 135–145)
Total Bilirubin: 0.5 mg/dL (ref 0.3–1.2)
Total Protein: 6.2 g/dL — ABNORMAL LOW (ref 6.5–8.1)

## 2022-09-16 LAB — CBC WITH DIFFERENTIAL (CANCER CENTER ONLY)
Abs Immature Granulocytes: 0.02 10*3/uL (ref 0.00–0.07)
Basophils Absolute: 0 10*3/uL (ref 0.0–0.1)
Basophils Relative: 1 %
Eosinophils Absolute: 0.1 10*3/uL (ref 0.0–0.5)
Eosinophils Relative: 2 %
HCT: 44.8 % (ref 39.0–52.0)
Hemoglobin: 14.5 g/dL (ref 13.0–17.0)
Immature Granulocytes: 0 %
Lymphocytes Relative: 30 %
Lymphs Abs: 1.5 10*3/uL (ref 0.7–4.0)
MCH: 28.7 pg (ref 26.0–34.0)
MCHC: 32.4 g/dL (ref 30.0–36.0)
MCV: 88.5 fL (ref 80.0–100.0)
Monocytes Absolute: 0.4 10*3/uL (ref 0.1–1.0)
Monocytes Relative: 8 %
Neutro Abs: 2.8 10*3/uL (ref 1.7–7.7)
Neutrophils Relative %: 59 %
Platelet Count: 162 10*3/uL (ref 150–400)
RBC: 5.06 MIL/uL (ref 4.22–5.81)
RDW: 13.1 % (ref 11.5–15.5)
WBC Count: 4.8 10*3/uL (ref 4.0–10.5)
nRBC: 0 % (ref 0.0–0.2)

## 2022-09-16 NOTE — Progress Notes (Unsigned)
[facility] [phone]   Fax:(336) [phone]  PROGRESS NOTE  Patient Care Team: Daylene Katayama, [place] as PCP - General (Family Medicine)  Hematological/Oncological History # Pulmonary Embolism/ RLE DVT  02/21/2022: CT PE study showed acute PE with CT evidence of right heart strain  02/22/2022: [place] LE showed cute deep vein thrombosis involving the right  femoral vein, right popliteal vein, right posterior tibial veins, right  soleal veins, and right gastrocnemius veins.  03/18/2022: establish care with Dr. Leonides Schanz   Interval History:  Patrick Krueger 77 y.o. male with medical history significant for pulmonary embolism and right lower extremity DVT who presents for a follow up visit. The patient's last visit was on 03/18/2022 at which time he established care.. In the interim since the last visit he has had no major changes in his health.  On exam today Patrick Krueger reports that he is tolerating his Eliquis therapy well.  Is not causing any bleeding or bruising.  He reports that he is not having any signs or symptoms concerning for recurrent VTE such as chest pain, shortness of breath, leg swelling, or leg pain.  He reports that if he does exert himself while swimming he can have some shortness of breath.  He is had no other major changes in his health in the interim since her last visit.  He notes that he did meet a woman's age who is quite physically active and is keeping him moving.  He is lost 13 pounds since that time.  He is also eating a better diet because of this person.  He currently denies any fevers, chills, sweats, nausea, vomiting or diarrhea.  A full 10 point ROS is otherwise negative.  MEDICAL HISTORY:  No past medical history on file.  SURGICAL HISTORY: No past surgical history on file.  SOCIAL HISTORY: Social History   Socioeconomic History   Marital status: Divorced    Spouse name: Not on file   Number of children: Not on file   Years of  education: Not on file   Highest education level: Not on file  Occupational History   Not on file  Tobacco Use   Smoking status: Not on file   Smokeless tobacco: Not on file  Substance and Sexual Activity   Alcohol use: Not on file   Drug use: Not on file   Sexual activity: Not on file  Other Topics Concern   Not on file  Social History Narrative   Not on file   Social Determinants of Health   Financial Resource Strain: Not on file  Food Insecurity: Not on file  Transportation Needs: Not on file  Physical Activity: Not on file  Stress: Not on file  Social Connections: Not on file  Intimate Partner Violence: Not on file    FAMILY HISTORY: No family history on file.  ALLERGIES:  has No Known Allergies.  MEDICATIONS:  Current Outpatient Medications  Medication Sig Dispense Refill   atorvastatin (LIPITOR) 10 MG tablet Take 10 mg by mouth at bedtime.     Cholecalciferol 50 MCG (2000 UT) CAPS Take 2,000 Units by mouth daily.     divalproex (DEPAKOTE) 500 MG DR tablet Take 500 mg by mouth daily.     losartan (COZAAR) 50 MG tablet Take 50 mg by mouth at bedtime.     Omega-3 Fatty Acids (KP FISH OIL) 1200 MG CAPS Take 1 capsule by mouth 2 (two) times daily.     venlafaxine XR (EFFEXOR-XR) 150 MG 24  hr capsule Take 1 capsule by mouth daily.     No current facility-administered medications for this visit.    REVIEW OF SYSTEMS:   Constitutional: ( - ) fevers, ( - )  chills , ( - ) night sweats Eyes: ( - ) blurriness of vision, ( - ) double vision, ( - ) watery eyes Ears, nose, mouth, throat, and face: ( - ) mucositis, ( - ) sore throat Respiratory: ( - ) cough, ( - ) dyspnea, ( - ) wheezes Cardiovascular: ( - ) palpitation, ( - ) chest discomfort, ( - ) lower extremity swelling Gastrointestinal:  ( - ) nausea, ( - ) heartburn, ( - ) change in bowel habits Skin: ( - ) abnormal skin rashes Lymphatics: ( - ) new lymphadenopathy, ( - ) easy bruising Neurological: ( - ) numbness,  ( - ) tingling, ( - ) new weaknesses Behavioral/Psych: ( - ) mood change, ( - ) new changes  All other systems were reviewed with the patient and are negative.  PHYSICAL EXAMINATION:  Vitals:   09/16/22 1108  BP: (!) 140/82  Pulse: (!) 48  Resp: 17  Temp: (!) 97.5 F (36.4 C)  SpO2: 96%   Filed Weights   09/16/22 1108  Weight: 250 lb 8 oz (113.6 kg)    GENERAL: Well-appearing elderly Caucasian male, alert, no distress and comfortable SKIN: skin color, texture, turgor are normal, no rashes or significant lesions EYES: conjunctiva are pink and non-injected, sclera clear LUNGS: clear to auscultation and percussion with normal breathing effort HEART: regular rate & rhythm and no murmurs and no lower extremity edema Musculoskeletal: no cyanosis of digits and no clubbing  PSYCH: alert & oriented x 3, fluent speech NEURO: no focal motor/sensory deficits  LABORATORY DATA:  I have reviewed the data as listed    Latest Ref Rng & Units 09/16/2022   10:40 AM 03/18/2022    2:13 PM 02/22/2022    5:35 AM  CBC  WBC 4.0 - 10.5 K/uL 4.8  5.3  7.7   Hemoglobin 13.0 - 17.0 g/dL 16.1  09.6  04.5   Hematocrit 39.0 - 52.0 % 44.8  45.4  44.5   Platelets 150 - 400 K/uL 162  155  166        Latest Ref Rng & Units 09/16/2022   10:40 AM 03/18/2022    2:13 PM 02/21/2022    1:55 PM  CMP  Glucose 70 - 99 mg/dL 409  811  914   BUN 8 - 23 mg/dL 17  16  18    Creatinine 0.61 - 1.24 mg/dL 7.82  9.56  2.13   Sodium 135 - 145 mmol/L 142  140  140   Potassium 3.5 - 5.1 mmol/L 4.2  4.2  4.6   Chloride 98 - 111 mmol/L 108  106  106   CO2 22 - 32 mmol/L 31  29  26    Calcium 8.9 - 10.3 mg/dL 8.9  9.1  9.1   Total Protein 6.5 - 8.1 g/dL 6.2  6.6  7.0   Total Bilirubin 0.3 - 1.2 mg/dL 0.5  0.5  0.7   Alkaline Phos 38 - 126 U/L 44  53  54   AST 15 - 41 U/L 12  14  13    ALT 0 - 44 U/L 9  11  11      RADIOGRAPHIC STUDIES: No results found.  ASSESSMENT & PLAN Patrick Krueger 77 y.o. male with medical  history significant for pulmonary  embolism and right lower extremity DVT who presents for a follow up visit.   After review of the labs, review of the records, and discussion with the patient the patients findings are most consistent with a provoked pulmonary embolism in the setting of prolonged immobility and RSV infection.   A provoked venous thromboembolism (VTE) is one that has a clear inciting factor or event. Provoking factors include prolonged travel/immobility, surgery (particularly abdominal or orthopedic), trauma,  and pregnancy/ estrogen containing birth control. This patient was reported to have a RSV infection and prolonged immobility, which would qualify as a transient provoking factor. As such we would recommend 3-6 months of anticoagulation therapy with consideration of additional therapy if symptoms persist. The anticoagulation therapy of choice in this situation is continued Eliquis. The patient has a supply of this medication and can afford it without difficulty. We will plan to see the patient back in 3 months time to reassess and assure they are doing well on treatment.    #Provoked DVT/Pulmonary Embolism  --findings at this time are consistent with a provoked VTE  --baseline CMP and CBC are adequate for DOAC therapy  --recommend the patient continue eliquis 5mg  BID until he has completed his current supply.  Afterwards no further anticoagulation therapy is needed. --patient denies any bleeding, bruising, or dark stools on this medication. It is well tolerated. No difficulties accessing/affording the medication  --RTC PRN if he were to have any signs or symptoms concerning for recurrent VTE.  No orders of the defined types were placed in this encounter.   All questions were answered. The patient knows to call the clinic with any problems, questions or concerns.  A total of more than 30 minutes were spent on this encounter with face-to-face time and non-face-to-face time,  including preparing to see the patient, ordering tests and/or medications, counseling the patient and coordination of care as outlined above.   Ulysees Barns, MD Department of Hematology/Oncology [facility] Phone: [phone] Pager: [phone] Email: [email or web address]  09/20/2022 10:41 AM

## 2023-10-26 ENCOUNTER — Emergency Department (HOSPITAL_BASED_OUTPATIENT_CLINIC_OR_DEPARTMENT_OTHER)

## 2023-10-26 ENCOUNTER — Emergency Department (HOSPITAL_BASED_OUTPATIENT_CLINIC_OR_DEPARTMENT_OTHER): Admission: EM | Admit: 2023-10-26 | Discharge: 2023-10-26 | Disposition: A | Discharge status: Home / Self Care

## 2023-10-26 ENCOUNTER — Encounter (HOSPITAL_BASED_OUTPATIENT_CLINIC_OR_DEPARTMENT_OTHER): Payer: Self-pay | Admitting: Emergency Medicine

## 2023-10-26 ENCOUNTER — Other Ambulatory Visit: Payer: Self-pay

## 2023-10-26 DIAGNOSIS — I8001 Phlebitis and thrombophlebitis of superficial vessels of right lower extremity: Secondary | ICD-10-CM | POA: Insufficient documentation

## 2023-10-26 DIAGNOSIS — Z79899 Other long term (current) drug therapy: Secondary | ICD-10-CM | POA: Insufficient documentation

## 2023-10-26 DIAGNOSIS — I10 Essential (primary) hypertension: Secondary | ICD-10-CM | POA: Insufficient documentation

## 2023-10-26 DIAGNOSIS — I809 Phlebitis and thrombophlebitis of unspecified site: Secondary | ICD-10-CM

## 2023-10-26 DIAGNOSIS — I8391 Asymptomatic varicose veins of right lower extremity: Secondary | ICD-10-CM | POA: Diagnosis not present

## 2023-10-26 DIAGNOSIS — I8390 Asymptomatic varicose veins of unspecified lower extremity: Secondary | ICD-10-CM

## 2023-10-26 DIAGNOSIS — R2241 Localized swelling, mass and lump, right lower limb: Secondary | ICD-10-CM | POA: Diagnosis present

## 2023-10-26 LAB — BASIC METABOLIC PANEL WITH GFR
Anion gap: 9 (ref 5–15)
BUN: 18 mg/dL (ref 8–23)
CO2: 26 mmol/L (ref 22–32)
Calcium: 8.9 mg/dL (ref 8.9–10.3)
Chloride: 107 mmol/L (ref 98–111)
Creatinine, Ser: 0.9 mg/dL (ref 0.61–1.24)
GFR, Estimated: >60 mL/min (ref >60)
Glucose, Bld: 132 mg/dL — ABNORMAL HIGH (ref 70–99)
Potassium: 4.6 mmol/L (ref 3.5–5.1)
Sodium: 142 mmol/L (ref 135–145)

## 2023-10-26 LAB — CBC
HCT: 45.9 % (ref 39.0–52.0)
Hemoglobin: 14.8 g/dL (ref 13.0–17.0)
MCH: 28.2 pg (ref 26.0–34.0)
MCHC: 32.2 g/dL (ref 30.0–36.0)
MCV: 87.6 fL (ref 80.0–100.0)
Platelets: 163 K/uL (ref 150–400)
RBC: 5.24 MIL/uL (ref 4.22–5.81)
RDW: 13 % (ref 11.5–15.5)
WBC: 4.4 K/uL (ref 4.0–10.5)
nRBC: 0 % (ref 0.0–0.2)

## 2023-10-26 MED ORDER — NAPROXEN 375 MG PO TABS: 375.0000 mg | ORAL_TABLET | Freq: Two times a day (BID) | ORAL | 0 refills | Status: AC

## 2023-10-26 NOTE — Discharge Instructions (Addendum)
 Please take the naproxen  twice daily and use your compression socks.  Please call and schedule a follow-up appointment with the vascular surgeon at the number provided.  Return to the emergency department with chest pain, worsening leg swelling, or other concerning symptoms.

## 2023-10-26 NOTE — ED Provider Notes (Signed)
 [place] [facility] Provider Note   CSN: [id number] Arrival date & time: 10/26/23  9076     Patient presents with: Leg Swelling   Patrick Krueger is a 78 y.o. male.   78 year old male with past medical history of hypertension and pulmonary embolism last year presenting to the emergency department today with right lower extremity swelling.  The patient states this been going on now for the past 3 days or so.  He denies any chest pain or shortness of breath with this.  States he does have some soreness in his right calf.  He was on Eliquis  last year and tolerated that well.  Denies any blood in his stool or dark stools.  He did take his blood pressure medication this morning just prior to coming in.        Prior to Admission medications   Medication Sig Start Date End Date Taking? Authorizing Provider  naproxen  (NAPROSYN ) 375 MG tablet Take 1 tablet (375 mg total) by mouth 2 (two) times daily. 10/26/23  Yes Ula Prentice SAUNDERS, MD  atorvastatin  (LIPITOR) 10 MG tablet Take 10 mg by mouth at bedtime. 09/09/19   [provider]  Cholecalciferol 50 MCG (2000 UT) CAPS Take 2,000 Units by mouth daily.    [provider]  divalproex  (DEPAKOTE ) 500 MG DR tablet Take 500 mg by mouth daily. 11/04/15   [provider]  losartan  (COZAAR ) 50 MG tablet Take 50 mg by mouth at bedtime. 01/11/22   [provider]  Omega-3 Fatty Acids (KP FISH OIL) 1200 MG CAPS Take 1 capsule by mouth 2 (two) times daily.    [provider]  venlafaxine  XR (EFFEXOR -XR) 150 MG 24 hr capsule Take 1 capsule by mouth daily. 01/11/22   [provider]    Allergies: Patient has no known allergies.    Review of Systems  Cardiovascular:  Positive for leg swelling.  All other systems reviewed and are negative.   Updated Vital Signs BP (!) 160/84   Pulse (!) 52   Temp 98.6 F (37 C) (Oral)   Resp 18   SpO2 95%   Physical Exam Vitals and  nursing note reviewed.   Gen: NAD Eyes: PERRL, EOMI HEENT: no oropharyngeal swelling Neck: trachea midline Resp: clear to auscultation bilaterally Card: RRR, no murmurs, rubs, or gallops Abd: nontender, nondistended Extremities: The patient does have some calf tenderness on the right with 1+ pitting edema over the right lower extremity Vascular: 2+ radial pulses bilaterally, 2+ DP pulses bilaterally Neuro: No focal deficits Skin: no rashes Psyc: acting appropriately   (all labs ordered are listed, but only abnormal results are displayed) Labs Reviewed  BASIC METABOLIC PANEL WITH GFR - Abnormal; Notable for the following components:      Result Value   Glucose, Bld 132 (*)    All other components within normal limits  CBC    EKG: None  Radiology: US  Venous Img Lower Unilateral Right Result Date: 10/26/2023 CLINICAL DATA:  RIGHT calf swelling and pain EXAM: RIGHT LOWER EXTREMITY VENOUS DOPPLER ULTRASOUND TECHNIQUE: Gray-scale sonography with compression, as well as color and duplex ultrasound, were performed to evaluate the deep venous system(s) from the level of the common femoral vein through the popliteal and proximal calf veins. COMPARISON:  None available FINDINGS: VENOUS Normal compressibility of the common femoral, superficial femoral, and popliteal veins, as well as the visualized calf veins. Visualized portions of profunda femoral vein and great saphenous vein unremarkable. No  filling defects to suggest DVT on grayscale or color Doppler imaging. Doppler waveforms show normal direction of venous flow, normal respiratory plasticity and response to augmentation. Limited views of the contralateral common femoral vein are unremarkable. OTHER Thrombosed varicose veins noted in the RIGHT calf. Limitations: none IMPRESSION: 1. No right lower extremity DVT. 2. Superficial thrombophlebitis of RIGHT calf varicose veins. Electronically Signed   By: Aliene Lloyd M.D.   On: 10/26/2023 11:14      Procedures   Medications Ordered in the ED - No data to display                                  Medical Decision Making 78 year old male with past medical history of hypertension and pulmonary embolism last year presenting to the emergency department today with right lower extremity swelling.  I will further evaluate the patient here with basic labs to evaluate for endorgan dysfunction or anemia as he may require restarting the Eliquis .  Will obtain ultrasound of his lower extremity to evaluate for DVT.  The patient denies any chest pain, shortness of breath, or symptoms consistent with pulmonary embolism at this time.  I will reevaluate for ultimate disposition.  The patient's labs here are reassuring.  Ultrasound shows findings consistent with thrombophlebitis of varicose veins.  There are no findings concerning for infection here.  The patient will be treated with NSAIDs with vascular follow-up with return precautions.  Amount and/or Complexity of Data Reviewed Labs: ordered.  Risk Prescription drug management.        Final diagnoses:  Thrombophlebitis  Varicose veins of calf    ED Discharge Orders          Ordered    naproxen  (NAPROSYN ) 375 MG tablet  2 times daily        10/26/23 1154               Ula Prentice SAUNDERS, MD 10/26/23 1155

## 2023-10-26 NOTE — ED Notes (Signed)
 Pt alert and oriented X 4 at the time of discharge. RR even and unlabored. No acute distress noted. Pt verbalized understanding of discharge instructions as discussed. Pt ambulatory to lobby at time of discharge.

## 2023-10-26 NOTE — ED Triage Notes (Signed)
 C/o R leg swelling and soreness x 2 days. No discoloration or tempature difference noted. Hx of PE. No thinners

## 2024-01-05 ENCOUNTER — Other Ambulatory Visit: Payer: Self-pay

## 2024-01-05 DIAGNOSIS — I8001 Phlebitis and thrombophlebitis of superficial vessels of right lower extremity: Secondary | ICD-10-CM

## 2024-01-23 NOTE — Progress Notes (Signed)
 Please pull order for change to cpap settings

## 2024-01-23 NOTE — Progress Notes (Signed)
 [facility] Pulmonary, Critical Care and Sleep Medicine   Name: Patrick Krueger MRN: [id number] DOB: 05/20/1945 Referring provider: Kreg Marin, PA-C  Chief Complaint  Patient presents with  . Sleep Apnea    Doing well with cpap machine    Summary  78 y.o. male with obstructive sleep apnea.  Subjective   Using CPAP has helped.  He tried a nasal pillow mask at first, but then switched to a full face mask.  This fits better.  He gets air leak after using CPAP for several hours.  This wakes him up and then he takes the mask off and goes back to sleep.  He was told that zepbound wasn't covered by insurance.  He is not having any progress with weight loss.  Past Medical History  Anxiety, PE with Rt leg DVT December 2023, Arthritis, Hyperlipidemia, DM type 2, Sensorineural hearing loss  Past Surgical History  He  has a past surgical history that includes Knee arthroscopy (Left, 1991); Cystoscopy (2005); and Eye surgery.  Social History  He  reports that he has never smoked. He has been exposed to tobacco smoke. He has never used smokeless tobacco. He reports current alcohol use of about 1.0 standard drink of alcohol per week. He reports that he does not use drugs.  Family History  His family history includes Arthritis in his father; Cancer in his father; Colon polyps in his sister; Diabetes in his sister; Hearing loss in his maternal uncle and mother; Heart disease in his brother; Hyperlipidemia in his mother; Hypertension in his brother.    Vital Signs  BP 132/88   Pulse 64   Temp 98.2 F (36.8 C) (Oral)   Resp 17   Ht 1.778 m (5' 10)   Wt 125 kg (276 lb 8 oz)   SpO2 95%   BMI 39.67 kg/m   Physical Exam   General - pleasant ENT - no sinus tenderness, no oral exudate, MP 3, no LAN Cardiac - regular rate and rhythm, no murmurs Chest - breath sounds equal bilaterally, no wheezing or rales Ext - no edema, cyanosis, or clubbing Skin - no  rashes Psych - normal mood and behavior   Pulmonary Tests    Sleep Tests  HST 09/13/23 >> pAHI 67.8, SpO2 low 51% Auto CPAP 12/24/23 to 01/22/24 >> used on 25 of 30 nights with average 5 hrs 34 min.  Average AHI 6.8 with median CPAP 11 and 95 th percentile CPAP 13 cm H2O    Chest Imaging  CT angio chest 02/21/22 >> scarring in lingula, 1.5 cm Lt renal cyst, PE Rt PA with RV:LV 1.2  Cardiac Tests  U/S lower extremity 02/21/22 >> acute DVT Rt femoral, popliteal, posterior tibial, soleal, gastrocnemius veins Echo 03/24/22 >> EF 60 to 65%, grade 1 DD, aortic root 37 mm    Assessment/Plan   Obstructive sleep apnea. - he is compliant with CPAP and reports benefit from therapy - he uses [facility] for his DME - current CPAP ordered July 2025 - will adjust his setting to auto CPAP 5 to 13 cm H2O and see if this improves air leak - discussed how he can download the [employer] APP to his phone  Obesity. - he was told that ozempic and zebound were not covered by his insurance plan - will arrange for referral to medical weight loss clinic  Patient Instructions   Patient Instructions  Will have your auto CPAP setting adjusted.  Will arrange for referral to  medical weight loss program.  Follow up in 1 year.  Allergies and Medications  No Known Allergies    Medication List       * Accurate as of January 23, 2024 11:58 AM. If you have any questions, ask your nurse or doctor.          CONTINUE taking these medications    aspirin 81 mg EC tablet Take 81 mg by mouth daily.   atorvastatin  10 mg tablet Commonly known as: LIPITOR Take 1 tablet (10 mg total) by mouth daily.   divalproex  500 mg 12 hr tablet Commonly known as: DEPAKOTE  DR Take 1 tablet (500 mg total) by mouth daily.   fish oil-dha-epa 1,200-144-216 mg Cap Take 1 capsule by mouth 2 (two) times a day.   losartan  50 mg tablet Commonly known as: COZAAR  Take 1 tablet (50 mg total) by mouth daily.   sildenafiL 100  mg tablet Commonly known as: VIAGRA Take 1 tablet (100 mg total) by mouth daily as needed for erectile dysfunction.   venlafaxine  75 mg 24 hr capsule Commonly known as: EFFEXOR  XR Take 1 capsule (75 mg total) by mouth daily.   Vitamin D3 50 mcg (2,000 unit) Cap capsule Generic drug: cholecalciferol Take 1 capsule by mouth daily.        Time Spent  On the day of the visit I spent 27 minutes preparing to see the patient, obtaining and/or reviewing separately obtained history, performing a medically appropriate examination and evaluation, counseling and educating the patient/caregiver, ordering medications, test, or procedures, and documenting clinical information in the electronic medical record.This time does not include any time spent performing procedures or assesments that are separately billable.    Signature  Carolynne Allan, MD 01/23/2024, 11:58 AM  [address]   [address]   [address] [place], [place] [address] [place], [place] [address]  [place], [place] [address] [phone]  [phone]  [phone] - [phone]  *Some images could not be shown.

## 2024-01-30 ENCOUNTER — Ambulatory Visit (HOSPITAL_COMMUNITY)
Admission: RE | Admit: 2024-01-30 | Discharge: 2024-01-30 | Disposition: A | Discharge status: Home / Self Care | Source: Ambulatory Visit | Attending: Vascular Surgery | Admitting: Vascular Surgery

## 2024-01-30 ENCOUNTER — Ambulatory Visit: Admitting: Vascular Surgery

## 2024-01-30 ENCOUNTER — Encounter: Payer: Self-pay | Admitting: Vascular Surgery

## 2024-01-30 VITALS — BP 135/63 | HR 52 | Temp 98.3°F | Ht 70.0 in | Wt 273.0 lb

## 2024-01-30 DIAGNOSIS — I8001 Phlebitis and thrombophlebitis of superficial vessels of right lower extremity: Secondary | ICD-10-CM | POA: Diagnosis not present

## 2024-01-30 DIAGNOSIS — I872 Venous insufficiency (chronic) (peripheral): Secondary | ICD-10-CM | POA: Diagnosis present

## 2024-01-30 NOTE — Progress Notes (Signed)
 [facility] [place]  ASSESSMENT / PLAN: 78 y.o. male with superficial venous thrombosis which has resolved. He has underlying chronic venous insufficiency which he is managing well. Recommend continued compression and elevation for swelling symptoms. Follow up with me as needed.  CHIEF COMPLAINT: superficial venous thrombosis  HISTORY OF PRESENT ILLNESS: Patrick Krueger is a 78 y.o. male referred to clinic for evaluation of right leg swelling and pain. He was ultimately diagnosed with superficial thrombosis of a varicosity in the right leg. He has a history of DVT/PE which was treated with a sufficient course of anticoagluation. He has been maintained on aspirin since. He reports symptom resolution. We reviewed his duplex.   Past Medical History:  Diagnosis Date   Diabetes mellitus without complication (HCC)    Hyperlipidemia    Hypertension     Past Surgical History:  Procedure Laterality Date   BLADDER SURGERY     KNEE SURGERY Left     History reviewed. No pertinent family history.  Social History   Socioeconomic History   Marital status: Divorced    Spouse name: Not on file   Number of children: Not on file   Years of education: Not on file   Highest education level: Not on file  Occupational History   Not on file  Tobacco Use   Smoking status: Never   Smokeless tobacco: Never  Substance and Sexual Activity   Alcohol use: Yes    Comment: occasional   Drug use: Not on file   Sexual activity: Not on file  Other Topics Concern   Not on file  Social History Narrative   Not on file   Social Drivers of Health   Financial Resource Strain: Not on file  Food Insecurity: Low Risk  (01/23/2024)   Received from [facility]   Hunger Vital Sign    Within the past 12 months, you worried that your food would run out before you got money to buy more: Never true    Within the past 12 months, the food you bought just didn't last and you didn't have money  to get more. : Never true  Transportation Needs: No Transportation Needs (01/23/2024)   Received from [facility]    In the past 12 months, has lack of reliable transportation kept you from medical appointments, meetings, work or from getting things needed for daily living? : No  Physical Activity: Not on file  Stress: Not on file  Social Connections: Not on file  Intimate Partner Violence: Not on file    No Known Allergies  Current Outpatient Medications  Medication Sig Dispense Refill   aspirin EC 81 MG tablet Take 81 mg by mouth daily.     atorvastatin  (LIPITOR) 10 MG tablet Take 10 mg by mouth at bedtime.     Cholecalciferol 50 MCG (2000 UT) CAPS Take 2,000 Units by mouth daily.     divalproex  (DEPAKOTE ) 500 MG DR tablet Take 500 mg by mouth daily.     losartan  (COZAAR ) 50 MG tablet Take 50 mg by mouth at bedtime.     naproxen  (NAPROSYN ) 375 MG tablet Take 1 tablet (375 mg total) by mouth 2 (two) times daily. 20 tablet 0   Omega-3 Fatty Acids (KP FISH OIL) 1200 MG CAPS Take 1 capsule by mouth 2 (two) times daily.     venlafaxine  XR (EFFEXOR -XR) 150 MG 24 hr capsule Take 1 capsule by mouth daily.     No current facility-administered  medications for this visit.    PHYSICAL EXAM Vitals:   01/30/24 1513  BP: 135/63  Pulse: (!) 52  Temp: 98.3 F (36.8 C)  SpO2: 94%  Weight: 273 lb (123.8 kg)  Height: 5' 10 (1.778 m)   No distress Regular rate and rhythm Unlabored breathing   PERTINENT LABORATORY AND RADIOLOGIC DATA  Most recent CBC    Latest Ref Rng & Units 10/26/2023    9:36 AM 09/16/2022   10:40 AM 03/18/2022    2:13 PM  CBC  WBC 4.0 - 10.5 K/uL 4.4  4.8  5.3   Hemoglobin 13.0 - 17.0 g/dL 85.1  85.4  84.5   Hematocrit 39.0 - 52.0 % 45.9  44.8  45.4   Platelets 150 - 400 K/uL 163  162  155      Most recent CMP    Latest Ref Rng & Units 10/26/2023    9:36 AM 09/16/2022   10:40 AM 03/18/2022    2:13 PM  CMP  Glucose 70 - 99 mg/dL 867  879   845   BUN 8 - 23 mg/dL 18  17  16    Creatinine 0.61 - 1.24 mg/dL 9.09  9.14  9.09   Sodium 135 - 145 mmol/L 142  142  140   Potassium 3.5 - 5.1 mmol/L 4.6  4.2  4.2   Chloride 98 - 111 mmol/L 107  108  106   CO2 22 - 32 mmol/L 26  31  29    Calcium  8.9 - 10.3 mg/dL 8.9  8.9  9.1   Total Protein 6.5 - 8.1 g/dL  6.2  6.6   Total Bilirubin 0.3 - 1.2 mg/dL  0.5  0.5   Alkaline Phos 38 - 126 U/L  44  53   AST 15 - 41 U/L  12  14   ALT 0 - 44 U/L  9  11    Lower Venous Reflux Study   Patient Name:  Deverick Pruss  Date of Exam:   01/30/2024  Medical Rec #: [id number]       Accession #:    [id number]  Date of Birth: 1945-11-26       Patient Gender: M  Patient Age:   38 years  Exam Location:  [address]  Procedure:      VAS US  LOWER EXTREMITY VENOUS REFLUX  Referring Phys: DEBBY ROBERTSON    ---------------------------------------------------------------------------  -----    Indications: Thrombophlebitis.    Risk Factors: Hx of PE.  Comparison Study: None.   Performing Technologist: Garnette Rockers     Examination Guidelines: A complete evaluation includes B-mode imaging,  spectral  Doppler, color Doppler, and power Doppler as needed of all accessible  portions  of each vessel. Bilateral testing is considered an integral part of a  complete  examination. Limited examinations for reoccurring indications may be  performed  as noted. The reflux portion of the exam is performed with the patient in  reverse Trendelenburg.  Significant venous reflux is defined as >500 ms in the superficial venous  system, and >1 second in the deep venous system.     Venous Reflux Times  +--------------+---------+------+-----------+------------+--------+  RIGHT        Reflux NoRefluxReflux TimeDiameter cmsComments                          Yes                                   +--------------+---------+------+-----------+------------+--------+  CFV                    yes   >1  second                       +--------------+---------+------+-----------+------------+--------+  FV mid                  yes   >1 second                       +--------------+---------+------+-----------+------------+--------+  Popliteal              yes   >1 second                       +--------------+---------+------+-----------+------------+--------+  GSV at [facility]              yes    >500 ms      1.42              +--------------+---------+------+-----------+------------+--------+  GSV prox thigh          yes    >500 ms      0.64              +--------------+---------+------+-----------+------------+--------+  GSV mid thigh no                            0.53              +--------------+---------+------+-----------+------------+--------+  GSV dist thighno                            0.57              +--------------+---------+------+-----------+------------+--------+  GSV at knee             yes    >500 ms      0.45              +--------------+---------+------+-----------+------------+--------+  GSV prox calf           yes    >500 ms      0.43              +--------------+---------+------+-----------+------------+--------+  GSV mid calf            yes    >500 ms      0.47              +--------------+---------+------+-----------+------------+--------+  GSV dist calf           yes    >500 ms      0.36              +--------------+---------+------+-----------+------------+--------+  SSV at [facility]    no                            0.37              +--------------+---------+------+-----------+------------+--------+  SSV prox calf no                            0.34              +--------------+---------+------+-----------+------------+--------+         Summary:  Right:  - No evidence of deep vein thrombosis seen in the right lower extremity,  from the common femoral through the popliteal veins.  - No  evidence of superficial venous thrombosis in the right lower  extremity.  - No evidence of superficial venous reflux seen in the right short  saphenous vein.  - Venous reflux is noted in the right common femoral vein.  - Venous reflux is noted in the right sapheno-femoral junction.  - Venous reflux is noted in the right greater saphenous vein in the thigh.  - Venous reflux is noted in the right greater saphenous vein in the calf.  - Venous reflux is noted in the right femoral vein.  - Venous reflux is noted in the right popliteal vein.  - Venous reflux is noted in the right perforator vein.    *See table(s) above for measurements and observations.   Electronically signed by Debby Robertson on 01/30/2024 at 4:59:10 PM.      Debby SAILOR. Robertson, MD FACS [facility] Phone Number: [phone] 01/30/2024 11:08 PM   Total time spent on preparing this encounter including chart review, data review, collecting history, examining the patient, and coordinating care: 30 min  Portions of this report may have been transcribed using voice recognition software.  Every effort has been made to ensure accuracy; however, inadvertent computerized transcription errors may still be present.

## 2024-02-06 ENCOUNTER — Other Ambulatory Visit: Payer: Self-pay

## 2024-02-06 ENCOUNTER — Emergency Department (HOSPITAL_COMMUNITY)
Admission: EM | Admit: 2024-02-06 | Discharge: 2024-02-06 | Discharge status: Against Medical Advice | Attending: Emergency Medicine | Admitting: Emergency Medicine

## 2024-02-06 ENCOUNTER — Encounter (HOSPITAL_COMMUNITY): Payer: Self-pay

## 2024-02-06 DIAGNOSIS — K0889 Other specified disorders of teeth and supporting structures: Secondary | ICD-10-CM | POA: Diagnosis not present

## 2024-02-06 DIAGNOSIS — Z5321 Procedure and treatment not carried out due to patient leaving prior to being seen by health care provider: Secondary | ICD-10-CM | POA: Insufficient documentation

## 2024-02-06 DIAGNOSIS — R519 Headache, unspecified: Secondary | ICD-10-CM | POA: Insufficient documentation

## 2024-02-06 NOTE — ED Provider Notes (Signed)
 [place] [facility] DEPARTMENT AT [facility] Provider Note   CSN: [id number] Arrival date & time: 02/06/24  2212     History Chief Complaint  Patient presents with   Facial Pain    HPI Patrick Krueger is a 78 y.o. male presenting for ***.   Patient's recorded medical, surgical, social, medication list and allergies were reviewed in the Snapshot window as part of the initial history.   Review of Systems   Review of Systems  Physical Exam Updated Vital Signs BP (!) 184/90 (BP Location: Right Arm)   Pulse (!) 55   Temp 98.2 F (36.8 C) (Oral)   Resp 18   Ht 5' 10 (1.778 m)   Wt 122.9 kg   SpO2 96%   BMI 38.88 kg/m  Physical Exam   ED Course/ Medical Decision Making/ A&P    Procedures Procedures   Medications Ordered in ED Medications - No data to display  Medical Decision Making:   Patrick Krueger is a 78 y.o. male who presented to the ED today with *** detailed above.    {crccomplexity:27900} Complete initial physical exam performed, notably the patient  was ***.    Reviewed and confirmed nursing documentation for past medical history, family history, social history.    Initial Assessment:   With the patient's presentation of ***, most likely diagnosis is ***. Other diagnoses were considered including (but not limited to) ***. These are considered less likely due to history of present illness and physical exam findings.   {crccopa:27899}  Initial Plan:  ***  ***Screening labs including CBC and Metabolic panel to evaluate for infectious or metabolic etiology of disease.  ***Urinalysis with reflex culture ordered to evaluate for UTI or relevant urologic/nephrologic pathology.  ***CXR to evaluate for structural/infectious intrathoracic pathology.  {crccardiactesting:32591::EKG to evaluate for cardiac pathology} Objective evaluation as below reviewed   Initial Study Results:   Laboratory  All laboratory results reviewed without evidence of  clinically relevant pathology.   ***Exceptions include: ***   ***EKG EKG was reviewed independently. Rate, rhythm, axis, intervals all examined and without medically relevant abnormality. ST segments without concerns for elevations.    Radiology:  All images reviewed independently. ***Agree with radiology report at this time.   VAS US  LOWER EXTREMITY VENOUS REFLUX Result Date: 01/30/2024  Lower Venous Reflux Study Patient Name:  Patrick Krueger  Date of Exam:   01/30/2024 Medical Rec #: [id number]       Accession #:    [id number] Date of Birth: [birth year]       Patient Gender: M Patient Age:   29 years Exam Location:  [address] Procedure:      VAS US  LOWER EXTREMITY VENOUS REFLUX Referring Phys: DEBBY ROBERTSON --------------------------------------------------------------------------------  Indications: Thrombophlebitis.  Risk Factors: Hx of PE. Comparison Study: None. Performing Technologist: Garnette Rockers  Examination Guidelines: A complete evaluation includes B-mode imaging, spectral Doppler, color Doppler, and power Doppler as needed of all accessible portions of each vessel. Bilateral testing is considered an integral part of a complete examination. Limited examinations for reoccurring indications may be performed as noted. The reflux portion of the exam is performed with the patient in reverse Trendelenburg. Significant venous reflux is defined as >500 ms in the superficial venous system, and >1 second in the deep venous system.  Venous Reflux Times +--------------+---------+------+-----------+------------+--------+ RIGHT         Reflux NoRefluxReflux TimeDiameter cmsComments  Yes                                  +--------------+---------+------+-----------+------------+--------+ CFV                     yes   >1 second                      +--------------+---------+------+-----------+------------+--------+ FV mid                  yes   >1 second                       +--------------+---------+------+-----------+------------+--------+ Popliteal               yes   >1 second                      +--------------+---------+------+-----------+------------+--------+ GSV at SFJ              yes    >500 ms      1.42             +--------------+---------+------+-----------+------------+--------+ GSV prox thigh          yes    >500 ms      0.64             +--------------+---------+------+-----------+------------+--------+ GSV mid thigh no                            0.53             +--------------+---------+------+-----------+------------+--------+ GSV dist thighno                            0.57             +--------------+---------+------+-----------+------------+--------+ GSV at knee             yes    >500 ms      0.45             +--------------+---------+------+-----------+------------+--------+ GSV prox calf           yes    >500 ms      0.43             +--------------+---------+------+-----------+------------+--------+ GSV mid calf            yes    >500 ms      0.47             +--------------+---------+------+-----------+------------+--------+ GSV dist calf           yes    >500 ms      0.36             +--------------+---------+------+-----------+------------+--------+ SSV at [facility]    no                            0.37             +--------------+---------+------+-----------+------------+--------+ SSV prox calf no                            0.34             +--------------+---------+------+-----------+------------+--------+   Summary: Right: - No evidence of deep vein thrombosis seen in  the right lower extremity, from the common femoral through the popliteal veins. - No evidence of superficial venous thrombosis in the right lower extremity. - No evidence of superficial venous reflux seen in the right short saphenous vein. - Venous reflux is noted in the right common femoral vein. -  Venous reflux is noted in the right sapheno-femoral junction. - Venous reflux is noted in the right greater saphenous vein in the thigh. - Venous reflux is noted in the right greater saphenous vein in the calf. - Venous reflux is noted in the right femoral vein. - Venous reflux is noted in the right popliteal vein. - Venous reflux is noted in the right perforator vein.  *See table(s) above for measurements and observations. Electronically signed by Debby Robertson on 01/30/2024 at 4:59:10 PM.    Final       Consults: Case discussed with ***.   Reassessment and Plan:   ***    ***  Clinical Impression: No diagnosis found.   Data Unavailable   Final Clinical Impression(s) / ED Diagnoses Final diagnoses:  None    Rx / DC Orders ED Discharge Orders     None

## 2024-02-06 NOTE — ED Triage Notes (Signed)
 Pt diagnosed with sinus infection at UC, placed on abx. States he is having facial pain and upper teeth pain on left side.

## 2024-02-06 NOTE — ED Notes (Signed)
 Pt stated he could no longer wait for doctor and left
